# Patient Record
Sex: Female | Born: 1970
Health system: Southern US, Community
[De-identification: ages and names within clinical notes are randomized; demographics above are authoritative.]

## PROBLEM LIST (undated history)

## (undated) DIAGNOSIS — E538 Deficiency of other specified B group vitamins: Secondary | ICD-10-CM

## (undated) DIAGNOSIS — N809 Endometriosis, unspecified: Secondary | ICD-10-CM

## (undated) DIAGNOSIS — E559 Vitamin D deficiency, unspecified: Secondary | ICD-10-CM

## (undated) DIAGNOSIS — N83209 Unspecified ovarian cyst, unspecified side: Secondary | ICD-10-CM

## (undated) HISTORY — PX: ABDOMINAL HYSTERECTOMY: SHX81

## (undated) HISTORY — DX: Unspecified ovarian cyst, unspecified side: N83.209

## (undated) HISTORY — DX: Endometriosis, unspecified: N80.9

## (undated) HISTORY — PX: DILATION AND CURETTAGE OF UTERUS: SHX78

## (undated) HISTORY — DX: Vitamin D deficiency, unspecified: E55.9

## (undated) HISTORY — DX: Deficiency of other specified B group vitamins: E53.8

---

## 2004-12-25 ENCOUNTER — Inpatient Hospital Stay: Payer: Self-pay | Admitting: Obstetrics and Gynecology

## 2007-10-20 ENCOUNTER — Emergency Department: Payer: Self-pay | Admitting: Emergency Medicine

## 2007-10-21 ENCOUNTER — Emergency Department: Payer: Self-pay | Admitting: Emergency Medicine

## 2007-10-22 ENCOUNTER — Emergency Department: Payer: Self-pay | Admitting: Emergency Medicine

## 2007-10-25 ENCOUNTER — Emergency Department: Payer: Self-pay | Admitting: Emergency Medicine

## 2007-10-29 ENCOUNTER — Emergency Department: Payer: Self-pay | Admitting: Emergency Medicine

## 2007-11-05 ENCOUNTER — Emergency Department: Payer: Self-pay | Admitting: Emergency Medicine

## 2007-11-19 ENCOUNTER — Emergency Department: Payer: Self-pay | Admitting: Unknown Physician Specialty

## 2015-03-02 DIAGNOSIS — E559 Vitamin D deficiency, unspecified: Secondary | ICD-10-CM | POA: Insufficient documentation

## 2015-03-02 DIAGNOSIS — E538 Deficiency of other specified B group vitamins: Secondary | ICD-10-CM | POA: Insufficient documentation

## 2015-03-03 ENCOUNTER — Encounter: Payer: Self-pay | Admitting: Family Medicine

## 2015-03-03 ENCOUNTER — Ambulatory Visit (INDEPENDENT_AMBULATORY_CARE_PROVIDER_SITE_OTHER): Payer: BLUE CROSS/BLUE SHIELD | Admitting: Family Medicine

## 2015-03-03 VITALS — BP 134/85 | HR 64 | Temp 97.1°F | Ht 66.25 in | Wt 147.0 lb

## 2015-03-03 DIAGNOSIS — R450 Nervousness: Secondary | ICD-10-CM | POA: Diagnosis not present

## 2015-03-03 DIAGNOSIS — E538 Deficiency of other specified B group vitamins: Secondary | ICD-10-CM

## 2015-03-03 DIAGNOSIS — R634 Abnormal weight loss: Secondary | ICD-10-CM | POA: Diagnosis not present

## 2015-03-03 DIAGNOSIS — N898 Other specified noninflammatory disorders of vagina: Secondary | ICD-10-CM | POA: Diagnosis not present

## 2015-03-03 DIAGNOSIS — A499 Bacterial infection, unspecified: Secondary | ICD-10-CM

## 2015-03-03 DIAGNOSIS — Z0000 Encounter for general adult medical examination without abnormal findings: Secondary | ICD-10-CM

## 2015-03-03 DIAGNOSIS — B9689 Other specified bacterial agents as the cause of diseases classified elsewhere: Secondary | ICD-10-CM | POA: Insufficient documentation

## 2015-03-03 DIAGNOSIS — N760 Acute vaginitis: Secondary | ICD-10-CM

## 2015-03-03 DIAGNOSIS — Z8041 Family history of malignant neoplasm of ovary: Secondary | ICD-10-CM | POA: Diagnosis not present

## 2015-03-03 DIAGNOSIS — R1900 Intra-abdominal and pelvic swelling, mass and lump, unspecified site: Secondary | ICD-10-CM

## 2015-03-03 DIAGNOSIS — N644 Mastodynia: Secondary | ICD-10-CM | POA: Diagnosis not present

## 2015-03-03 DIAGNOSIS — E559 Vitamin D deficiency, unspecified: Secondary | ICD-10-CM

## 2015-03-03 HISTORY — DX: Intra-abdominal and pelvic swelling, mass and lump, unspecified site: R19.00

## 2015-03-03 MED ORDER — METRONIDAZOLE 500 MG PO TABS: 500.0000 mg | ORAL_TABLET | Freq: Two times a day (BID) | ORAL | Status: DC

## 2015-03-03 NOTE — Assessment & Plan Note (Signed)
Patient not sure if BRCA testing has been done; she'll check with other family member(s)

## 2015-03-03 NOTE — Assessment & Plan Note (Signed)
Clue cells on wet mount; treat with metronidazole; explained NOT sexually transmitted; no EtOH on med

## 2015-03-03 NOTE — Assessment & Plan Note (Signed)
Pelvic and breast issues, but will also check thyroid function since she feels jittery

## 2015-03-03 NOTE — Assessment & Plan Note (Signed)
Check vit D level. 

## 2015-03-03 NOTE — Assessment & Plan Note (Signed)
Check cbc and cmp 

## 2015-03-03 NOTE — Assessment & Plan Note (Signed)
Check level today 

## 2015-03-03 NOTE — Assessment & Plan Note (Signed)
May be hormonal, ovarian cyst, no lumps felt on exam; bilateral mammo ordered

## 2015-03-03 NOTE — Progress Notes (Signed)
BP 134/85 mmHg  Pulse 64  Temp(Src) 97.1 F (36.2 C)  Ht 5' 6.25" (1.683 m)  Wt 147 lb (66.679 kg)  BMI 23.54 kg/m2  SpO2 100%   Subjective:    Patient ID: Cassidy Lewis, female    DOB: 1970/11/11, 44 y.o.   MRN: [id number]  HPI: Jordon Bourquin is a 44 y.o. female  Chief Complaint  Patient presents with  . Menopause    Menopausal symptoms, jittery   Patient says she knows she needs to get a mammogram; she has never had one; her breasts feel bruised, like a seatbelt was across her breast Both sides bother her, left is worse No lumps there Husband said she should have a mammogram She hates wearing a bra; sore to the touch, tender She gets rid of the bra as soon as she gets home No nipple discharge, no lumps, no skin changes LMP was years ago due to hysterectomy (endometriosis) Strong hx of ovarian cancer and endometriosis; sister had hysterectomy for endometriosis; daughter has something similar to endometrosis; mother had hysterectomy and then got ovarian cancer; 2nd cousin died from ovarian cancer Last female exam and pap smear was two years ago  Feels jittery, scratching ; no abdominal bloating; no nausea She has lost a little bit of weight, but has gone back to work, back on the move  Relevant past medical, surgical, family and social history reviewed and updated as indicated. Interim medical history since our last visit reviewed. Allergies and medications reviewed and updated.  Review of Systems  Constitutional: Positive for unexpected weight change.  Gastrointestinal: Negative for nausea and abdominal distention.  Genitourinary: Negative for pelvic pain.   Per HPI unless specifically indicated above     Objective:    BP 134/85 mmHg  Pulse 64  Temp(Src) 97.1 F (36.2 C)  Ht 5' 6.25" (1.683 m)  Wt 147 lb (66.679 kg)  BMI 23.54 kg/m2  SpO2 100%  Wt Readings from Last 3 Encounters:  03/03/15 147 lb (66.679 kg)  04/14/14 158 lb (71.668 kg)    Physical  Exam  Constitutional: She appears well-developed and well-nourished.  Weight loss 11 pounds since September (10 months)  Eyes: EOM are normal. No scleral icterus.  Cardiovascular: Normal rate and regular rhythm.   Pulmonary/Chest: Effort normal and breath sounds normal. No respiratory distress. She has no wheezes. She has no rales. Right breast exhibits no inverted nipple, no mass, no nipple discharge, no skin change and no tenderness. Left breast exhibits no inverted nipple, no mass, no nipple discharge, no skin change and no tenderness. Breasts are symmetrical.  Abdominal: Soft. Bowel sounds are normal. She exhibits no distension and no mass. There is no tenderness. There is no guarding.  Genitourinary: No breast swelling, discharge or bleeding. Pelvic exam was performed with patient supine. There is no rash on the right labia. There is no rash on the left labia. Right adnexum displays no mass, no tenderness and no fullness. Left adnexum displays fullness. Left adnexum displays no mass (fullness) and no tenderness. No erythema, tenderness or bleeding in the vagina. Vaginal discharge (whitish, scant) found.  Skin: Skin is warm and dry. No erythema.  Few papular erythematous lesions left wrist, left hand, upper chest; these are not purpuric; no vesicles  Psychiatric: She has a normal mood and affect. Her speech is normal and behavior is normal. Judgment and thought content normal. Cognition and memory are normal.    No results found for this or any previous visit.  Assessment & Plan:   Problem List Items Addressed This Visit      Digestive   Vitamin B12 deficiency    Check level today      Relevant Orders   Vitamin B12     Genitourinary   Bacterial vaginosis    Clue cells on wet mount; treat with metronidazole; explained NOT sexually transmitted; no EtOH on med      Relevant Medications   metroNIDAZOLE (FLAGYL) 500 MG tablet     Other   Vitamin D deficiency disease    Check vit  D level      Relevant Orders   Vit D  25 hydroxy (rtn osteoporosis monitoring)   Breast tenderness in female - Primary    May be hormonal, ovarian cyst, no lumps felt on exam; bilateral mammo ordered      Relevant Orders   FSH   LH   MM Digital Diagnostic Bilat   US Pelvis Complete   Jittery    Check TSH      Unintended weight loss    Pelvic and breast issues, but will also check thyroid function since she feels jittery      Relevant Orders   TSH   US Pelvis Complete   Family history of ovarian cancer    Patient not sure if BRCA testing has been done; she'll check with other family member(s)      Relevant Orders   US Pelvis Complete   Preventative health care    Check cbc and cmp      Relevant Orders   Comprehensive metabolic panel   CBC with Differential/Platelet   Vaginal discharge    Wet mount positive for clue cells; treat with metronidazole      Relevant Orders   WET PREP FOR [place], YEAST, CLUE   Pelvic fullness in female    ddx includes ovarian cyst, abd wall lipoma, stool burden, but with fam hx of ovarian cancer and her weight loss, will get pelvic US      Relevant Orders   US Pelvis Complete      Follow up plan: Return in about 3 weeks (around 03/24/2015) for weight loss, jitteriness, other issues.   Orders Placed This Encounter  Procedures  . WET PREP FOR [employer], YEAST, CLUE  . MM Digital Diagnostic Bilat    Standing Status: Future     Number of Occurrences:      Standing Expiration Date: 05/03/2016    Order Specific Question:  Reason for Exam (SYMPTOM  OR DIAGNOSIS REQUIRED)    Answer:  breast tenderness    Order Specific Question:  Is the patient pregnant?    Answer:  No    Order Specific Question:  Preferred imaging location?    Answer:  [facility]  . US Pelvis Complete    Standing Status: Future     Number of Occurrences:      Standing Expiration Date: 05/03/2016    Order Specific Question:  Reason for Exam (SYMPTOM  OR  DIAGNOSIS REQUIRED)    Answer:  pelvic fullness, breast tenderness; fam hx of ovarian cancer    Order Specific Question:  Preferred imaging location?    Answer:  [facility]  . Vitamin B12  . Vit D  25 hydroxy (rtn osteoporosis monitoring)  . TSH  . Comprehensive metabolic panel    Order Specific Question:  Has the patient fasted?    Answer:  Yes  . CBC with Differential/Platelet  . [place]  . [place]

## 2015-03-03 NOTE — Assessment & Plan Note (Signed)
Check TSH 

## 2015-03-03 NOTE — Assessment & Plan Note (Signed)
Wet mount positive for clue cells; treat with metronidazole

## 2015-03-03 NOTE — Patient Instructions (Signed)
I have ordered a mammogram and a pelvic US We'll let you know about the labs If you have not heard anything from my staff in a week about any orders/referrals/studies from today, please contact us here to follow-up [phone] Do practice monthly self-breast exams and get yearly (or every other year) mammmograms

## 2015-03-03 NOTE — Assessment & Plan Note (Signed)
ddx includes ovarian cyst, abd wall lipoma, stool burden, but with fam hx of ovarian cancer and her weight loss, will get pelvic US

## 2015-03-04 ENCOUNTER — Telehealth: Payer: Self-pay

## 2015-03-04 ENCOUNTER — Encounter: Payer: Self-pay | Admitting: Family Medicine

## 2015-03-04 DIAGNOSIS — R1900 Intra-abdominal and pelvic swelling, mass and lump, unspecified site: Secondary | ICD-10-CM

## 2015-03-04 LAB — COMPREHENSIVE METABOLIC PANEL
ALBUMIN: 4.4 g/dL (ref 3.5–5.5)
ALT: 13 IU/L (ref 0–32)
AST: 12 IU/L (ref 0–40)
Albumin/Globulin Ratio: 1.7 (ref 1.1–2.5)
Alkaline Phosphatase: 88 IU/L (ref 39–117)
BILIRUBIN TOTAL: 0.2 mg/dL (ref 0.0–1.2)
BUN/Creatinine Ratio: 15 (ref 9–23)
BUN: 9 mg/dL (ref 6–24)
CALCIUM: 9.6 mg/dL (ref 8.7–10.2)
CHLORIDE: 99 mmol/L (ref 97–108)
CO2: 25 mmol/L (ref 18–29)
Creatinine, Ser: 0.6 mg/dL (ref 0.57–1.00)
GFR, EST AFRICAN AMERICAN: 127 mL/min/{1.73_m2} (ref >59)
GFR, EST NON AFRICAN AMERICAN: 110 mL/min/{1.73_m2} (ref >59)
GLOBULIN, TOTAL: 2.6 g/dL (ref 1.5–4.5)
GLUCOSE: 91 mg/dL (ref 65–99)
Potassium: 4.7 mmol/L (ref 3.5–5.2)
Sodium: 139 mmol/L (ref 134–144)
Total Protein: 7 g/dL (ref 6.0–8.5)

## 2015-03-04 LAB — WET PREP FOR TRICH, YEAST, CLUE
Clue Cell Exam: POSITIVE — AB
TRICHOMONAS EXAM: NEGATIVE
Yeast Exam: NEGATIVE

## 2015-03-04 LAB — CBC WITH DIFFERENTIAL/PLATELET
BASOS: 0 %
Basophils Absolute: 0 10*3/uL (ref 0.0–0.2)
EOS (ABSOLUTE): 0.2 10*3/uL (ref 0.0–0.4)
Eos: 2 %
HEMATOCRIT: 37.9 % (ref 34.0–46.6)
HEMOGLOBIN: 12.4 g/dL (ref 11.1–15.9)
Immature Grans (Abs): 0 10*3/uL (ref 0.0–0.1)
Immature Granulocytes: 0 %
LYMPHS ABS: 2.2 10*3/uL (ref 0.7–3.1)
Lymphs: 23 %
MCH: 28.7 pg (ref 26.6–33.0)
MCHC: 32.7 g/dL (ref 31.5–35.7)
MCV: 88 fL (ref 79–97)
MONOCYTES: 6 %
Monocytes Absolute: 0.6 10*3/uL (ref 0.1–0.9)
NEUTROS ABS: 6.5 10*3/uL (ref 1.4–7.0)
Neutrophils: 69 %
PLATELETS: 249 10*3/uL (ref 150–379)
RBC: 4.32 x10E6/uL (ref 3.77–5.28)
RDW: 13.3 % (ref 12.3–15.4)
WBC: 9.5 10*3/uL (ref 3.4–10.8)

## 2015-03-04 LAB — LUTEINIZING HORMONE: LH: 6.2 m[IU]/mL

## 2015-03-04 LAB — FOLLICLE STIMULATING HORMONE: FSH: 9.5 m[IU]/mL

## 2015-03-04 LAB — VITAMIN B12: Vitamin B-12: 813 pg/mL (ref 211–946)

## 2015-03-04 LAB — VITAMIN D 25 HYDROXY (VIT D DEFICIENCY, FRACTURES): Vit D, 25-Hydroxy: 26.4 ng/mL — ABNORMAL LOW (ref 30.0–100.0)

## 2015-03-04 LAB — TSH: TSH: 1.63 u[IU]/mL (ref 0.450–4.500)

## 2015-03-04 NOTE — Telephone Encounter (Signed)
New order entered

## 2015-03-04 NOTE — Assessment & Plan Note (Signed)
Hospital needs new order, transvag non-ob entered

## 2015-03-04 NOTE — Telephone Encounter (Signed)
The hospital needs her u/s changed to a pelvic with transvaginal.

## 2015-03-11 ENCOUNTER — Ambulatory Visit
Admission: RE | Admit: 2015-03-11 | Discharge: 2015-03-11 | Disposition: A | Discharge status: Home / Self Care | Payer: BLUE CROSS/BLUE SHIELD | Source: Ambulatory Visit | Attending: Family Medicine | Admitting: Family Medicine

## 2015-03-11 ENCOUNTER — Telehealth: Payer: Self-pay | Admitting: Family Medicine

## 2015-03-11 DIAGNOSIS — R1900 Intra-abdominal and pelvic swelling, mass and lump, unspecified site: Secondary | ICD-10-CM

## 2015-03-11 DIAGNOSIS — N8320 Unspecified ovarian cysts: Secondary | ICD-10-CM | POA: Insufficient documentation

## 2015-03-11 DIAGNOSIS — R634 Abnormal weight loss: Secondary | ICD-10-CM | POA: Diagnosis present

## 2015-03-11 DIAGNOSIS — N644 Mastodynia: Secondary | ICD-10-CM

## 2015-03-11 DIAGNOSIS — Z8041 Family history of malignant neoplasm of ovary: Secondary | ICD-10-CM

## 2015-03-11 NOTE — Telephone Encounter (Signed)
I spoke with patient, advised her I'd call [facility] on Monday to schedule her appointment, they are already closed today.

## 2015-03-14 ENCOUNTER — Other Ambulatory Visit: Payer: Self-pay

## 2015-03-14 ENCOUNTER — Telehealth: Payer: Self-pay | Admitting: Family Medicine

## 2015-03-14 DIAGNOSIS — Z1239 Encounter for other screening for malignant neoplasm of breast: Secondary | ICD-10-CM

## 2015-03-14 DIAGNOSIS — Z0000 Encounter for general adult medical examination without abnormal findings: Secondary | ICD-10-CM

## 2015-03-14 DIAGNOSIS — N83292 Other ovarian cyst, left side: Secondary | ICD-10-CM

## 2015-03-14 NOTE — Telephone Encounter (Signed)
Patient has a complex cyst on the left; f/u US recommended; with her family hx, I'm going to recommend referral to GYN for this and for f/u and management I called both numbers, left message asking her to call me here

## 2015-03-14 NOTE — Telephone Encounter (Signed)
Pt called back and i didn't feel comfortable discussing the referral because i wasn't sure of any details so pt said you could call her back at [phone].

## 2015-03-14 NOTE — Telephone Encounter (Signed)
Spoke with patient, clarified what Dr. Sherie Don had mentioned to her.

## 2015-03-14 NOTE — Telephone Encounter (Signed)
Patient notified of mammo appointment time. [facility] stated with her diagnosis, it needed to be a screening and not a diagnostic.

## 2015-03-14 NOTE — Telephone Encounter (Signed)
Pt states she does not fully understand everything Dr Sherie Don said when she called.  If you could call and clarify.

## 2015-03-14 NOTE — Telephone Encounter (Signed)
I explained report; will refer to GYN for monitoring, f/u imaging in 6 weeks; she agrees

## 2015-03-23 ENCOUNTER — Ambulatory Visit
Admission: RE | Admit: 2015-03-23 | Discharge: 2015-03-23 | Disposition: A | Discharge status: Home / Self Care | Payer: BLUE CROSS/BLUE SHIELD | Source: Ambulatory Visit | Attending: Family Medicine | Admitting: Family Medicine

## 2015-03-23 DIAGNOSIS — Z1239 Encounter for other screening for malignant neoplasm of breast: Secondary | ICD-10-CM

## 2015-03-23 DIAGNOSIS — Z1231 Encounter for screening mammogram for malignant neoplasm of breast: Secondary | ICD-10-CM | POA: Insufficient documentation

## 2015-03-23 DIAGNOSIS — Z0000 Encounter for general adult medical examination without abnormal findings: Secondary | ICD-10-CM

## 2015-03-23 DIAGNOSIS — R928 Other abnormal and inconclusive findings on diagnostic imaging of breast: Secondary | ICD-10-CM

## 2015-03-24 ENCOUNTER — Encounter: Payer: Self-pay | Admitting: Family Medicine

## 2015-03-24 ENCOUNTER — Telehealth: Payer: Self-pay

## 2015-03-24 ENCOUNTER — Ambulatory Visit (INDEPENDENT_AMBULATORY_CARE_PROVIDER_SITE_OTHER): Payer: BLUE CROSS/BLUE SHIELD | Admitting: Family Medicine

## 2015-03-24 VITALS — BP 137/83 | HR 85 | Temp 97.7°F | Wt 151.0 lb

## 2015-03-24 DIAGNOSIS — N8329 Other ovarian cysts: Secondary | ICD-10-CM

## 2015-03-24 DIAGNOSIS — B9689 Other specified bacterial agents as the cause of diseases classified elsewhere: Secondary | ICD-10-CM

## 2015-03-24 DIAGNOSIS — R450 Nervousness: Secondary | ICD-10-CM | POA: Diagnosis not present

## 2015-03-24 DIAGNOSIS — N83292 Other ovarian cyst, left side: Secondary | ICD-10-CM

## 2015-03-24 DIAGNOSIS — R928 Other abnormal and inconclusive findings on diagnostic imaging of breast: Secondary | ICD-10-CM | POA: Diagnosis not present

## 2015-03-24 DIAGNOSIS — R634 Abnormal weight loss: Secondary | ICD-10-CM | POA: Diagnosis not present

## 2015-03-24 DIAGNOSIS — N760 Acute vaginitis: Secondary | ICD-10-CM

## 2015-03-24 DIAGNOSIS — A499 Bacterial infection, unspecified: Secondary | ICD-10-CM | POA: Diagnosis not present

## 2015-03-24 NOTE — Progress Notes (Signed)
BP 137/83 mmHg  Pulse 85  Temp(Src) 97.7 F (36.5 C)  Wt 151 lb (68.493 kg)  SpO2 100%   Subjective:    Patient ID: Cassidy Lewis, female    DOB: [birth year], 44 y.o.   MRN: [id number]  HPI: Cassidy Lewis is a 44 y.o. female  Chief Complaint  Patient presents with  . Weight Loss    follow up  . OTHER    Jittery  . Results    discuss her lab results from last visit  She feels jittery Trying to cut back on caffeine Has a can of Pepsi in the morning to get her going Does not eat breakfast in the morning Goes in at 4 am for work; takes a break around 9 am, eats something then; mozarella sticks; muffin today with ham, egg, cheese; maybe a bag of chips or snack around 3 pm; can of soup for dinner or regular dinner, variable; average day she might drink 3 cans of pepsi but has been cutting back lately; drinking more water at work and has recruited help; can manage 48 ounces of water a day She has never really tried to eat something after the shakes start; she has started to think about it and thinks maybe it is happening when she hasn't eaten anything Thyroid test was normal Not going through menopause according to the [facility] and FSH just drawn, reviewed those and other labs with her today  Relevant past medical, surgical, family and social history reviewed and updated as indicated. Interim medical history since our last visit reviewed. Allergies and medications reviewed and updated.  Review of Systems  Per HPI unless specifically indicated above     Objective:    BP 137/83 mmHg  Pulse 85  Temp(Src) 97.7 F (36.5 C)  Wt 151 lb (68.493 kg)  SpO2 100%  Wt Readings from Last 3 Encounters:  03/24/15 151 lb (68.493 kg)  03/03/15 147 lb (66.679 kg)  04/14/14 158 lb (71.668 kg)    Physical Exam  Constitutional: She appears well-developed and well-nourished. No distress.  Eyes: No scleral icterus.  Neck: No thyromegaly present.  Cardiovascular: Normal rate.    Pulmonary/Chest: Effort normal.  Abdominal: She exhibits no distension.  Neurological: She displays no tremor. Gait normal.  Skin: No pallor.  Psychiatric: She has a normal mood and affect. Her behavior is normal. Judgment and thought content normal.   Results for orders placed or performed in visit on 03/03/15  WET PREP FOR TRICH, YEAST, CLUE  Result Value Ref Range   Trichomonas Exam Negative Negative   Yeast Exam Negative Negative   Clue Cell Exam Positive (A) Negative  Vitamin B12  Result Value Ref Range   Vitamin B-12 813 211 - 946 pg/mL  Vit D  25 hydroxy (rtn osteoporosis monitoring)  Result Value Ref Range   Vit D, 25-Hydroxy 26.4 (L) 30.0 - 100.0 ng/mL  TSH  Result Value Ref Range   TSH 1.630 0.450 - 4.500 uIU/mL  Comprehensive metabolic panel  Result Value Ref Range   Glucose 91 65 - 99 mg/dL   BUN 9 6 - 24 mg/dL   Creatinine, Ser 4.09 0.57 - 1.00 mg/dL   GFR calc non Af Amer 110 >59 mL/min/1.73   GFR calc Af Amer 127 >59 mL/min/1.73   BUN/Creatinine Ratio 15 9 - 23   Sodium 139 134 - 144 mmol/L   Potassium 4.7 3.5 - 5.2 mmol/L   Chloride 99 97 - 108 mmol/L   CO2 25  18 - 29 mmol/L   Calcium 9.6 8.7 - 10.2 mg/dL   Total Protein 7.0 6.0 - 8.5 g/dL   Albumin 4.4 3.5 - 5.5 g/dL   Globulin, Total 2.6 1.5 - 4.5 g/dL   Albumin/Globulin Ratio 1.7 1.1 - 2.5   Bilirubin Total 0.2 0.0 - 1.2 mg/dL   Alkaline Phosphatase 88 39 - 117 IU/L   AST 12 0 - 40 IU/L   ALT 13 0 - 32 IU/L  CBC with Differential/Platelet  Result Value Ref Range   WBC 9.5 3.4 - 10.8 x10E3/uL   RBC 4.32 3.77 - 5.28 x10E6/uL   Hemoglobin 12.4 11.1 - 15.9 g/dL   Hematocrit 16.1 09.6 - 46.6 %   MCV 88 79 - 97 fL   MCH 28.7 26.6 - 33.0 pg   MCHC 32.7 31.5 - 35.7 g/dL   RDW 04.5 40.9 - 81.1 %   Platelets 249 150 - 379 x10E3/uL   Neutrophils 69 %   Lymphs 23 %   Monocytes 6 %   Eos 2 %   Basos 0 %   Neutrophils Absolute 6.5 1.4 - 7.0 x10E3/uL   Lymphocytes Absolute 2.2 0.7 - 3.1 x10E3/uL    Monocytes Absolute 0.6 0.1 - 0.9 x10E3/uL   EOS (ABSOLUTE) 0.2 0.0 - 0.4 x10E3/uL   Basophils Absolute 0.0 0.0 - 0.2 x10E3/uL   Immature Granulocytes 0 %   Immature Grans (Abs) 0.0 0.0 - 0.1 x10E3/uL  FSH  Result Value Ref Range   FSH 9.5 mIU/mL  LH  Result Value Ref Range   LH 6.2 mIU/mL      Assessment & Plan:   Problem List Items Addressed This Visit      Genitourinary   Bacterial vaginosis    Status post treatment; reviewed lab with her; no need for husband to be treated      Complex cyst of left ovary    She is waiting for appt with GYN; if appt not arranged prior to the time when the [place] is due, patient was asked to call me and I'll order the [place]; I do not want that delayed; asked her to do this on the 6 week side, not the 12 week side; she agrees        Other   Jittery - Primary    Suspicious for reactive hypoglycemia; the more she explained her eating habits, skipping meals, drinking caffeinated beverages, I suspect her blood sugars are dropping; check FSBS during episodes, advised snacks to help raise sugar without precipitating yet another low sugar shortly thereafter      Unintended weight loss    Complex ovarian cyst; patient has been referred to GYN rather than wait for repeat US; however, patient knows that if GYN appt delayed, she is to call me for the follow-up scan      Abnormal mammogram of right breast    Reviewed findings in report with her; additional imaging has been ordered         Follow up plan: No Follow-up on file. Patient to call  An after-visit summary was printed and given to the patient at check-out.  Please see the patient instructions which may contain other information and recommendations beyond what is mentioned above in the assessment and plan.

## 2015-03-24 NOTE — Patient Instructions (Addendum)
Consider taking rose hip oil and/or vitamin E supplements for breast tenderness For one or two weeks, check your blood sugar when you start to feel jittery Eat a little something, don't eat pure sugar, something with some carbs and a little protein instead of just skittles and M&Ms Check sugar after eating, 20-30 minutes later to see if it's come up and you feel better We'll get additional breast images Your follow-up ultrasound should be 6 weeks after August 5th If your appointment with gynecologist is delayed, just call me before that 6 week window and I'll order the [place] Continue the vitamin D supplementation Give me feedback over the phone in a few weeks and let me know how you are doing We can repeat your thyroid test in 8 weeks if you are still having symptoms

## 2015-03-26 NOTE — Assessment & Plan Note (Signed)
Reviewed findings in report with her; additional imaging has been ordered

## 2015-03-26 NOTE — Assessment & Plan Note (Signed)
Complex ovarian cyst; patient has been referred to GYN rather than wait for repeat US; however, patient knows that if GYN appt delayed, she is to call me for the follow-up scan

## 2015-03-26 NOTE — Assessment & Plan Note (Signed)
She is waiting for appt with GYN; if appt not arranged prior to the time when the [place] is due, patient was asked to call me and I'll order the [place]; I do not want that delayed; asked her to do this on the 6 week side, not the 12 week side; she agrees

## 2015-03-26 NOTE — Assessment & Plan Note (Signed)
Status post treatment; reviewed lab with her; no need for husband to be treated

## 2015-03-26 NOTE — Assessment & Plan Note (Signed)
Suspicious for reactive hypoglycemia; the more she explained her eating habits, skipping meals, drinking caffeinated beverages, I suspect her blood sugars are dropping; check FSBS during episodes, advised snacks to help raise sugar without precipitating yet another low sugar shortly thereafter

## 2015-03-30 ENCOUNTER — Ambulatory Visit
Admission: RE | Admit: 2015-03-30 | Discharge: 2015-03-30 | Disposition: A | Discharge status: Home / Self Care | Payer: BLUE CROSS/BLUE SHIELD | Source: Ambulatory Visit | Attending: Family Medicine | Admitting: Family Medicine

## 2015-03-30 DIAGNOSIS — R928 Other abnormal and inconclusive findings on diagnostic imaging of breast: Secondary | ICD-10-CM | POA: Diagnosis present

## 2015-03-30 DIAGNOSIS — N6001 Solitary cyst of right breast: Secondary | ICD-10-CM | POA: Insufficient documentation

## 2015-05-12 ENCOUNTER — Encounter: Payer: BLUE CROSS/BLUE SHIELD | Admitting: Obstetrics and Gynecology

## 2015-06-09 ENCOUNTER — Ambulatory Visit (INDEPENDENT_AMBULATORY_CARE_PROVIDER_SITE_OTHER): Payer: BLUE CROSS/BLUE SHIELD | Admitting: Obstetrics and Gynecology

## 2015-06-09 ENCOUNTER — Encounter: Payer: Self-pay | Admitting: Obstetrics and Gynecology

## 2015-06-09 VITALS — BP 123/79 | HR 80 | Ht 66.25 in | Wt 152.1 lb

## 2015-06-09 DIAGNOSIS — N83202 Unspecified ovarian cyst, left side: Secondary | ICD-10-CM | POA: Diagnosis not present

## 2015-06-09 DIAGNOSIS — Z8041 Family history of malignant neoplasm of ovary: Secondary | ICD-10-CM | POA: Diagnosis not present

## 2015-06-09 DIAGNOSIS — N951 Menopausal and female climacteric states: Secondary | ICD-10-CM

## 2015-06-10 ENCOUNTER — Encounter: Payer: Self-pay | Admitting: Obstetrics and Gynecology

## 2015-06-10 DIAGNOSIS — N951 Menopausal and female climacteric states: Secondary | ICD-10-CM | POA: Insufficient documentation

## 2015-06-10 NOTE — Progress Notes (Signed)
GYNECOLOGY PROGRESS NOTE  Subjective:    Patient ID: Cassidy Lewis, female    DOB: 1970-08-25, 44 y.o.   MRN: [id number]  HPI  Patient is a 44 y.o. female who presents as a referral from [facility] for findings of ovarian cyst on ultrasound.  Patient notes that she was having mild left intermittent pelvic pain, which prompted evaluation.  Patient notes h/o ovarian cysts in the past.    Past Medical History  Diagnosis Date  . Vitamin D deficiency disease   . Vitamin B12 deficiency   . Ovarian cyst   . Endometriosis     Past Surgical History  Procedure Laterality Date  . Dilation and curettage of uterus    . Cesarean section      x 3  . Abdominal hysterectomy      partial    Family History  Problem Relation Age of Onset  . Cancer Mother     ovarian  . Diabetes Father   . Hyperlipidemia Father   . Hypertension Father   . Diabetes Maternal Grandmother   . Cancer Paternal Grandfather     lung cancer, died at age 7    Social History   Social History  . Marital Status: Married    Spouse Name: N/A  . Number of Children: N/A  . Years of Education: N/A   Occupational History  . Not on file.   Social History Main Topics  . Smoking status: Never Smoker   . Smokeless tobacco: Never Used  . Alcohol Use: No     Comment: very rare  . Drug Use: No  . Sexual Activity: Yes    Birth Control/ Protection: None   Other Topics Concern  . Not on file   Social History Narrative    Current Outpatient Prescriptions on File Prior to Visit  Medication Sig Dispense Refill  . Cholecalciferol (VITAMIN D3) 5000 UNITS TABS Take 5,000 Units by mouth daily.    . Multiple Vitamins-Minerals (HAIR SKIN AND NAILS FORMULA PO) Take by mouth daily.    . vitamin B-12 (CYANOCOBALAMIN) 250 MCG tablet Take 250 mcg by mouth daily.     No current facility-administered medications on file prior to visit.    Allergies  Allergen Reactions  . Penicillins Anaphylaxis      Review of Systems A comprehensive review of systems was negative except for: Constitutional: positive for weight loss Genitourinary: positive for hot flashes and night sweats, and pelvic pain (intermittent, mild)  Objective:   Blood pressure 123/79, pulse 80, height 5' 6.25" (1.683 m), weight 152 lb 1.6 oz (68.992 kg). General appearance: alert and no distress Abdomen: soft, non-tender; bowel sounds normal; no masses,  no organomegaly Pelvic: external genitalia normal, no adnexal masses or tenderness, rectovaginal septum normal, uterus surgically absent and vagina normal without discharge Extremities: extremities normal, atraumatic, no cyanosis or edema Neurologic: Grossly normal   Imaging:  Pelvic Ultrasound 03/11/2015 FINDINGS: Uterus  The uterus is surgically absent.  Right ovary: Measurements: 2.2 x 1.6 x 2.5 cm. There are prominent follicles but no suspicious mass.  Left ovary: Measurements: 3.4 x 1.5 x 2.6 cm. There prominent follicles. There is a complex cystic structure measuring 1.2 x 0.8 x 1.3 cm.  Other findings: No free fluid.  IMPRESSION: 1. Small complex cystic structure in the left ovary. This likely reflects a hemorrhagic cyst. There is no free pelvic fluid. 2. Normal appearing right ovary. 3. Follow-up pelvic ultrasound in 6-12 weeks is recommended.  Assessment:  Complex small left ovarian cyst  Family h/o ovarian cancer Vasomotor symptoms  Plan:   1) Patient with ovarian cyst, also with h/o ovarian cysts in the past.  Cyst appears to be hemorrhagic based on ultrasound.  Dicussed that these cysts typically resolve without intervention.  Will repeat sono to assess if cyst is still present or has enlarged in size, as last sono was 2 months ago and patient still with intermittent symptoms.  2) Lengthy discussion had with patient regarding family history of ovarian cancer.  Patient notes mom and 1st cousin with h/o ovarian cancer, several family  members with endometriosis. Cancer family history form completed with patient.  Discussed BRCA gene testing.  Patient notes that her her PCP attempted once but that insurance would not cover. Will have patient referred to genetic counselor.  Can possibly have Myriad testing performed.  Given handout. Will discuss again at next visit.   3) Vasomotor symptoms with hot flushes and night sweats. Explained to patient that she was likely perimenopausal. Briefly discussed treatment options, including hormonal and non-hormonal therapy.   F/u in 2 weeks for ultrasound.    A total of 30 minutes were spent face-to-face with the patient during this encounter and over half of that time involved counseling and coordination of care.    Rubie Maid, MD [facility]

## 2015-06-29 ENCOUNTER — Ambulatory Visit (INDEPENDENT_AMBULATORY_CARE_PROVIDER_SITE_OTHER): Payer: BLUE CROSS/BLUE SHIELD | Admitting: Obstetrics and Gynecology

## 2015-06-29 ENCOUNTER — Encounter: Payer: Self-pay | Admitting: Obstetrics and Gynecology

## 2015-06-29 ENCOUNTER — Ambulatory Visit: Payer: BLUE CROSS/BLUE SHIELD

## 2015-06-29 VITALS — BP 127/88 | HR 74 | Ht 66.25 in | Wt 152.7 lb

## 2015-06-29 DIAGNOSIS — Z8041 Family history of malignant neoplasm of ovary: Secondary | ICD-10-CM

## 2015-06-29 DIAGNOSIS — Z8742 Personal history of other diseases of the female genital tract: Secondary | ICD-10-CM | POA: Diagnosis not present

## 2015-06-29 DIAGNOSIS — N83202 Unspecified ovarian cyst, left side: Secondary | ICD-10-CM

## 2015-06-29 NOTE — Progress Notes (Signed)
GYNECOLOGY PROGRESS NOTE  Subjective:    Patient ID: Cassidy Lewis, female    DOB: [birth year], 44 y.o.   MRN: [id number]  HPI  Patient is a 44 y.o. P54 female who presents for f/u after ultrasound for left ovarian cyst.  Notes that she still occasionally feels some small "twinges" of pain, but nothing severe.  Also for further discussion of genetic testing for family h/o ovarian cancer.   The following portions of the patient's history were reviewed and updated as appropriate: allergies, current medications, past family history, past medical history, past social history, past surgical history and problem list.  Review of Systems Pertinent items noted in HPI and remainder of comprehensive ROS otherwise negative.   Objective:   Blood pressure 127/88, pulse 74, height 5' 6.25" (1.683 m), weight 152 lb 11.2 oz (69.264 kg). General appearance: alert and no distress Remainder of exam deferred.   Imaging - Pelvic Ultrasound 06/29/2015:   Indications: F/U left ovarian cyst Findings:  The uterus is surgically absent s/p hysterectomy. Vaginal cuff appears wnl.   Right Ovary measures 2.7 x 2.0 x 1.8 cm. A dominant follicle is seen, otherwise, appears WNL. Left Ovary measures 3.5 x 1.8 x 2.1 cm. There are multiple dominant follicles seen, the largest measuring 1.6 x 1.0 x 1.4 cm. Survey of the adnexa demonstrates no adnexal masses. There is no free fluid in the cul de sac.  Impression: 1. Left ovary with multiple dominant follicles, largest measuring 1.6 x 1.0 x 1.4 cm. All appear simple, with no internal septations noted.  Recommendations: 1.Clinical correlation with the patient's History and Physical Exam.  Assessment:   Left ovarian cyst, resolved Family h/o ovarian cancer  Plan:   1.  Resolution of ovarian cyst noted. Patient with h/o cysts in the past.  Will continue to monitor for future cyst development.  Advised that most cysts respond to hormonal suppression (i.e.  Contraception).  Patient does not desire to be on contraception.   2. Patient with significant family history of ovarian cancer. Patient notes mom and 1st cousin with h/o ovarian cancer, several family members with endometriosis. Further discussed BRCA gene testing. Patient notes that her her PCP attempted once but that insurance would not cover. Referred to genetic counselor last visit but notes that she has not heard anything further regarding an appointment. Will proceed with  [facility] testing. Once results return, will have patient follow up for discussion.    A total of 15 minutes were spent face-to-face with the patient during this encounter and over half of that time dealt with counseling and coordination of care.   Rubie Maid, MD [facility]

## 2015-07-20 ENCOUNTER — Ambulatory Visit (INDEPENDENT_AMBULATORY_CARE_PROVIDER_SITE_OTHER): Payer: BLUE CROSS/BLUE SHIELD | Admitting: Family Medicine

## 2015-07-20 ENCOUNTER — Encounter: Payer: Self-pay | Admitting: Family Medicine

## 2015-07-20 VITALS — BP 128/85 | HR 81 | Temp 97.3°F | Ht 66.0 in | Wt 149.0 lb

## 2015-07-20 DIAGNOSIS — N83292 Other ovarian cyst, left side: Secondary | ICD-10-CM

## 2015-07-20 DIAGNOSIS — M7540 Impingement syndrome of unspecified shoulder: Secondary | ICD-10-CM | POA: Insufficient documentation

## 2015-07-20 DIAGNOSIS — Z8041 Family history of malignant neoplasm of ovary: Secondary | ICD-10-CM | POA: Diagnosis not present

## 2015-07-20 DIAGNOSIS — M25511 Pain in right shoulder: Secondary | ICD-10-CM | POA: Diagnosis not present

## 2015-07-20 DIAGNOSIS — M7541 Impingement syndrome of right shoulder: Secondary | ICD-10-CM

## 2015-07-20 DIAGNOSIS — R1900 Intra-abdominal and pelvic swelling, mass and lump, unspecified site: Secondary | ICD-10-CM

## 2015-07-20 NOTE — Assessment & Plan Note (Addendum)
May have element of rotator cuff injury; will try conservative measures, turmeric, ice, and refer to physical therapy; if not improving, she may call for referral to orthopaedist

## 2015-07-20 NOTE — Patient Instructions (Addendum)
Try ice topically 15-20 minutes at a time three times a day; cloth between skin and ice Try turmeric as a natural anti-inflammatory (for pain and arthritis). It comes in capsules where you buy aspirin and fish oil, but also as a spice where you buy pepper and garlic powder. We'll order physical therapy for you If not getting better, please call me and we'll have you see the orthopaedist

## 2015-07-20 NOTE — Assessment & Plan Note (Signed)
Followed by GYN, patient had 2nd [place]

## 2015-07-20 NOTE — Assessment & Plan Note (Signed)
Followed by GYN

## 2015-07-20 NOTE — Progress Notes (Signed)
BP 128/85 mmHg  Pulse 81  Temp(Src) 97.3 F (36.3 C)  Ht 5' 6"  (1.676 m)  Wt 149 lb (67.586 kg)  BMI 24.06 kg/m2  SpO2 99%   Subjective:    Patient ID: Cassidy Lewis, female    DOB: Mar 03, 1971, 44 y.o.   MRN: [id number]  HPI: Onell Mcmath is a 44 y.o. female  Chief Complaint  Patient presents with  . pulled muscle    Right arm. She thinks she pulled a muscle playing dodge ball several months ago, then played kick ball 2 months ago and it's worse now..    She feels like she has a knot in her right deltoid; tries to abduct her right arm/shoulder and it hurts; she was pulling dodgeball about five months ago, and had some soreness after that; then played kickball and agitated them again;  Trouble fixing her hair in the morning; cannot reach behind her back with the right arm No weakness or tingling except with shooting pain with movement; stopping and waiting and it will alleviate it Bringing the arm across the front of the chest eases things up Cannot sleep or lay on the right shoulder or arm Really feels tight along the right deltoid; feels bruised; tightens and gets worse with movement She has tried hot water in the shower which relaxes things up a bit She has not tried NSAIDs; her husband used to take a lot of Aleve and it messed up his kidneys so she is not interested in NSAIDS No old injuries to the right shoulder Right-handed  Relevant past medical, surgical, family and social history reviewed and updated as indicated. Interim medical history since our last visit reviewed. Allergies and medications reviewed and updated.  Review of Systems Per HPI unless specifically indicated above     Objective:    BP 128/85 mmHg  Pulse 81  Temp(Src) 97.3 F (36.3 C)  Ht 5' 6"  (1.676 m)  Wt 149 lb (67.586 kg)  BMI 24.06 kg/m2  SpO2 99%  Wt Readings from Last 3 Encounters:  07/20/15 149 lb (67.586 kg)  06/29/15 152 lb 11.2 oz (69.264 kg)  06/09/15 152 lb 1.6 oz (68.992  kg)   weight loss is intentional she reports  Physical Exam  Constitutional: She appears well-developed and well-nourished.  Eyes: No scleral icterus.  Neck: No JVD present.  Cardiovascular: Normal rate.   Pulmonary/Chest: Effort normal. No respiratory distress.  Musculoskeletal:       Right shoulder: She exhibits decreased range of motion and tenderness. She exhibits no bony tenderness, no swelling, no effusion, no crepitus, no deformity, no spasm, normal pulse and normal strength.  Limited abduction of right shoulder/arm  Psychiatric: She has a normal mood and affect.   Results for orders placed or performed in visit on 03/03/15  WET PREP FOR Hepburn, YEAST, CLUE  Result Value Ref Range   Trichomonas Exam Negative Negative   Yeast Exam Negative Negative   Clue Cell Exam Positive (A) Negative  Vitamin B12  Result Value Ref Range   Vitamin B-12 813 211 - 946 pg/mL  Vit D  25 hydroxy (rtn osteoporosis monitoring)  Result Value Ref Range   Vit D, 25-Hydroxy 26.4 (L) 30.0 - 100.0 ng/mL  TSH  Result Value Ref Range   TSH 1.630 0.450 - 4.500 uIU/mL  Comprehensive metabolic panel  Result Value Ref Range   Glucose 91 65 - 99 mg/dL   BUN 9 6 - 24 mg/dL   Creatinine, Ser 0.60 0.57 - 1.00  mg/dL   GFR calc non Af Amer 110 >59 mL/min/1.73   GFR calc Af Amer 127 >59 mL/min/1.73   BUN/Creatinine Ratio 15 9 - 23   Sodium 139 134 - 144 mmol/L   Potassium 4.7 3.5 - 5.2 mmol/L   Chloride 99 97 - 108 mmol/L   CO2 25 18 - 29 mmol/L   Calcium 9.6 8.7 - 10.2 mg/dL   Total Protein 7.0 6.0 - 8.5 g/dL   Albumin 4.4 3.5 - 5.5 g/dL   Globulin, Total 2.6 1.5 - 4.5 g/dL   Albumin/Globulin Ratio 1.7 1.1 - 2.5   Bilirubin Total 0.2 0.0 - 1.2 mg/dL   Alkaline Phosphatase 88 39 - 117 IU/L   AST 12 0 - 40 IU/L   ALT 13 0 - 32 IU/L  CBC with Differential/Platelet  Result Value Ref Range   WBC 9.5 3.4 - 10.8 x10E3/uL   RBC 4.32 3.77 - 5.28 x10E6/uL   Hemoglobin 12.4 11.1 - 15.9 g/dL   Hematocrit  37.9 34.0 - 46.6 %   MCV 88 79 - 97 fL   MCH 28.7 26.6 - 33.0 pg   MCHC 32.7 31.5 - 35.7 g/dL   RDW 13.3 12.3 - 15.4 %   Platelets 249 150 - 379 x10E3/uL   Neutrophils 69 %   Lymphs 23 %   Monocytes 6 %   Eos 2 %   Basos 0 %   Neutrophils Absolute 6.5 1.4 - 7.0 x10E3/uL   Lymphocytes Absolute 2.2 0.7 - 3.1 x10E3/uL   Monocytes Absolute 0.6 0.1 - 0.9 x10E3/uL   EOS (ABSOLUTE) 0.2 0.0 - 0.4 x10E3/uL   Basophils Absolute 0.0 0.0 - 0.2 x10E3/uL   Immature Granulocytes 0 %   Immature Grans (Abs) 0.0 0.0 - 0.1 x10E3/uL  FSH  Result Value Ref Range   FSH 9.5 mIU/mL  LH  Result Value Ref Range   LH 6.2 mIU/mL      Assessment & Plan:   Problem List Items Addressed This Visit      Genitourinary   Complex cyst of left ovary    Followed by GYN, patient had 2nd [place]        Other   Family history of ovarian cancer    Seeing GYN, they are working with insurance company to see about getting BRCA testing      Pelvic fullness in female    Followed by GYN      Impingement syndrome, shoulder - Primary    May have element of rotator cuff injury; will try conservative measures, turmeric, ice, and refer to physical therapy; if not improving, she may call for referral to orthopaedist      Relevant Orders   Ambulatory referral to Physical Therapy    Other Visit Diagnoses    Shoulder pain, right        Relevant Orders    Ambulatory referral to Physical Therapy       Follow up plan: No Follow-up on file.  Face-to-face time with patient was more than 15 minutes, >50% time spent counseling and coordination of care

## 2015-07-20 NOTE — Assessment & Plan Note (Signed)
Seeing GYN, they are working with insurance company to see about getting BRCA testing

## 2015-07-28 ENCOUNTER — Telehealth: Payer: Self-pay | Admitting: Obstetrics and Gynecology

## 2015-07-28 NOTE — Telephone Encounter (Signed)
Hey this pt had gentic testing, got a letter from ins that they denied. / please call her. She wants to know if they have run the test yet and they are trying to get approval . B/c she can't afford.[facility] and [employer] [place].

## 2017-05-28 ENCOUNTER — Encounter: Payer: Self-pay | Admitting: Family Medicine

## 2017-05-28 ENCOUNTER — Ambulatory Visit (INDEPENDENT_AMBULATORY_CARE_PROVIDER_SITE_OTHER): Payer: BLUE CROSS/BLUE SHIELD | Admitting: Family Medicine

## 2017-05-28 VITALS — BP 132/72 | HR 95 | Temp 98.2°F | Resp 14 | Wt 157.0 lb

## 2017-05-28 DIAGNOSIS — M545 Low back pain: Secondary | ICD-10-CM

## 2017-05-28 DIAGNOSIS — E538 Deficiency of other specified B group vitamins: Secondary | ICD-10-CM | POA: Diagnosis not present

## 2017-05-28 DIAGNOSIS — R3129 Other microscopic hematuria: Secondary | ICD-10-CM | POA: Diagnosis not present

## 2017-05-28 DIAGNOSIS — Z1231 Encounter for screening mammogram for malignant neoplasm of breast: Secondary | ICD-10-CM

## 2017-05-28 DIAGNOSIS — E559 Vitamin D deficiency, unspecified: Secondary | ICD-10-CM

## 2017-05-28 DIAGNOSIS — R109 Unspecified abdominal pain: Secondary | ICD-10-CM | POA: Diagnosis not present

## 2017-05-28 DIAGNOSIS — Z1239 Encounter for other screening for malignant neoplasm of breast: Secondary | ICD-10-CM

## 2017-05-28 DIAGNOSIS — N83292 Other ovarian cyst, left side: Secondary | ICD-10-CM

## 2017-05-28 HISTORY — DX: Unspecified abdominal pain: R10.9

## 2017-05-28 LAB — POCT URINALYSIS DIPSTICK
Bilirubin, UA: NEGATIVE
GLUCOSE UA: NEGATIVE
Ketones, UA: NEGATIVE
Leukocytes, UA: NEGATIVE
Nitrite, UA: NEGATIVE
PH UA: 5 (ref 5.0–8.0)
SPEC GRAV UA: 1.02 (ref 1.010–1.025)
UROBILINOGEN UA: 0.2 U/dL

## 2017-05-28 NOTE — Assessment & Plan Note (Signed)
Check urine micro for crystals and blood, order CT scan of the abd/pelvis; check renal function

## 2017-05-28 NOTE — Assessment & Plan Note (Signed)
Patient was seen by GYN

## 2017-05-28 NOTE — Patient Instructions (Signed)
We'll get the labs and CT scan and go from there Call with any changes

## 2017-05-28 NOTE — Assessment & Plan Note (Signed)
Check levela nd replace 

## 2017-05-28 NOTE — Assessment & Plan Note (Signed)
Check level and supplement as indicated 

## 2017-05-28 NOTE — Progress Notes (Signed)
BP 132/72   Pulse 95   Temp 98.2 F (36.8 C) (Oral)   Resp 14   Wt 157 lb (71.2 kg)   SpO2 97%   BMI 25.34 kg/m    Subjective:    Patient ID: Cassidy Lewis, female    DOB: [birth year], 46 y.o.   MRN: [id number]  HPI: Cassidy Retortonya Kellett is a 46 y.o. female  Chief Complaint  Patient presents with  . Follow-up  . Back Pain    right lower    HPI Patient is here after a long interval, last visit was 2016 Health has been fine in the meantime over the last two years Some stress, few headaches She is having back pain; not sure if muscles in her back, maybe kidneys Fine all day long Around 4 am, it starts to hurt and bother her, just on the left side Doesn't matter if she gets up and goes to the bathroom Eases off when she gets up and around Has cut down on the sodas; more flavored water and cranberry juice to flush out her system Drinks 3 cans of Pepsi a day, 12 ounces each; trying to limit to two; coffee in the afternoon Does eat salt Some improvement with that cranberry Hx of recurrent UTI, but not as an adult No blood in the urine No discoloration, no unusual urine odor No fevers; sometimes has pressure when waiting a long time at work before she urinates; can go 8 hours between urination; 4 am until 12 noon because of being so busy Pain has been going on for about 4 months Thought it might have been the bed, slept on a heating pad, did not change No hx of kidney stones, either personally or in the family No weakness of the legs; pain a little more sharp in the morning, then fine after 30 minutes or so Had a complex cyst on the front, followed by GYN; it went away because they couldn't find it again No blood in the stool; father was diagnosed this year with stomach cancer; did chemo and now it's gone; he had multiple biopsies of colon, 1 of 12 came back positive Also has hx of low vit D; last level 26.5 Low B12 too  Depression screen PHQ 2/9 05/28/2017  Decreased  Interest 0  Down, Depressed, Hopeless 0  PHQ - 2 Score 0    Relevant past medical, surgical, family and social history reviewed Past Medical History:  Diagnosis Date  . Endometriosis   . Ovarian cyst   . Vitamin B12 deficiency   . Vitamin D deficiency disease    Past Surgical History:  Procedure Laterality Date  . ABDOMINAL HYSTERECTOMY     partial  . CESAREAN SECTION     x 3  . DILATION AND CURETTAGE OF UTERUS     Family History  Problem Relation Age of Onset  . Cancer Mother        ovarian  . Stroke Mother        possibly a small stroke  . Diabetes Father   . Hyperlipidemia Father   . Hypertension Father   . Cancer Father        stomach cancer  . Cancer Paternal Grandfather        lung cancer, died at age 47  . Diabetes Maternal Grandmother   . Gout Maternal Grandmother   . Stroke Sister        fraction strokes  . Endometriosis Sister   . Migraines Sister   .  Heart failure Paternal Grandmother   . COPD Neg Hx   . Heart disease Neg Hx    Social History   Social History  . Marital status: Married    Spouse name: N/A  . Number of children: N/A  . Years of education: N/A   Occupational History  . Not on file.   Social History Main Topics  . Smoking status: Never Smoker  . Smokeless tobacco: Never Used  . Alcohol use No     Comment: very rare  . Drug use: No  . Sexual activity: Yes    Birth control/ protection: None   Other Topics Concern  . Not on file   Social History Narrative  . No narrative on file  nonsmoker  Interim medical history since last visit reviewed. Allergies and medications reviewed  Review of Systems Per HPI unless specifically indicated above     Objective:    BP 132/72   Pulse 95   Temp 98.2 F (36.8 C) (Oral)   Resp 14   Wt 157 lb (71.2 kg)   SpO2 97%   BMI 25.34 kg/m   Wt Readings from Last 3 Encounters:  05/28/17 157 lb (71.2 kg)  07/20/15 149 lb (67.6 kg)  06/29/15 152 lb 11.2 oz (69.3 kg)    Physical  Exam  Constitutional: She appears well-developed and well-nourished. No distress.  Weight gain 12 pounds over last 22(+/-) months  Eyes: EOM are normal. No scleral icterus.  Neck: No thyromegaly present.  Cardiovascular: Normal rate.   Pulmonary/Chest: Effort normal.  Abdominal: Soft. Bowel sounds are normal. She exhibits no distension and no mass. There is no hepatosplenomegaly. There is no tenderness. There is no guarding.  Skin: She is not diaphoretic. No pallor.  Psychiatric: She has a normal mood and affect. Her behavior is normal. Judgment and thought content normal.   Results for orders placed or performed in visit on 05/28/17  POCT urinalysis dipstick  Result Value Ref Range   Color, UA yellow    Clarity, UA clear    Glucose, UA neg    Bilirubin, UA neg    Ketones, UA neg    Spec Grav, UA 1.020 1.010 - 1.025   Blood, UA trace    pH, UA 5.0 5.0 - 8.0   Protein, UA trace    Urobilinogen, UA 0.2 0.2 or 1.0 E.U./dL   Nitrite, UA neg    Leukocytes, UA Negative Negative      Assessment & Plan:   Problem List Items Addressed This Visit      Genitourinary   Complex cyst of left ovary    Patient was seen by GYN        Other   Vitamin D deficiency disease    Check level and supplement as indicated      Relevant Orders   VITAMIN D 25 Hydroxy (Vit-D Deficiency, Fractures)   Vitamin B12 deficiency    Check level and replace      Relevant Orders   B12   Left flank pain - Primary    Check urine micro for crystals and blood, order CT scan of the abd/pelvis; check renal function      Relevant Orders   CBC with Differential/Platelet   COMPLETE METABOLIC PANEL WITH GFR   CT Abdomen Pelvis W Contrast    Other Visit Diagnoses    Right low back pain, unspecified chronicity, with sciatica presence unspecified       entered by staff -- her pain  is actually on the LEFT, but I cannot correct the diagnosis since it is linked to a test that has already been resulted    Relevant Orders   POCT urinalysis dipstick (Completed)   Screening for breast cancer       Relevant Orders   MM Digital Screening   Other microscopic hematuria       Relevant Orders   CT Abdomen Pelvis W Contrast   Urinalysis, microscopic only       Follow up plan: Return in about 3 weeks (around 06/18/2017) for complete physical.  An after-visit summary was printed and given to the patient at check-out.  Please see the patient instructions which may contain other information and recommendations beyond what is mentioned above in the assessment and plan.  No orders of the defined types were placed in this encounter.   Orders Placed This Encounter  Procedures  . MM Digital Screening  . CT Abdomen Pelvis W Contrast  . CBC with Differential/Platelet  . COMPLETE METABOLIC PANEL WITH GFR  . VITAMIN D 25 Hydroxy (Vit-D Deficiency, Fractures)  . B12  . Urinalysis, microscopic only  . POCT urinalysis dipstick

## 2017-05-29 ENCOUNTER — Other Ambulatory Visit: Payer: Self-pay | Admitting: Family Medicine

## 2017-05-29 ENCOUNTER — Ambulatory Visit: Payer: Self-pay | Admitting: *Deleted

## 2017-05-29 ENCOUNTER — Telehealth: Payer: Self-pay | Admitting: Family Medicine

## 2017-05-29 LAB — CBC WITH DIFFERENTIAL/PLATELET
BASOS ABS: 38 {cells}/uL (ref 0–200)
Basophils Relative: 0.4 %
EOS ABS: 154 {cells}/uL (ref 15–500)
Eosinophils Relative: 1.6 %
HEMATOCRIT: 39.5 % (ref 35.0–45.0)
HEMOGLOBIN: 13.4 g/dL (ref 11.7–15.5)
LYMPHS ABS: 2563 {cells}/uL (ref 850–3900)
MCH: 29.3 pg (ref 27.0–33.0)
MCHC: 33.9 g/dL (ref 32.0–36.0)
MCV: 86.4 fL (ref 80.0–100.0)
MPV: 13.1 fL — ABNORMAL HIGH (ref 7.5–12.5)
Monocytes Relative: 6.2 %
NEUTROS ABS: 6250 {cells}/uL (ref 1500–7800)
Neutrophils Relative %: 65.1 %
Platelets: 225 10*3/uL (ref 140–400)
RBC: 4.57 10*6/uL (ref 3.80–5.10)
RDW: 11.6 % (ref 11.0–15.0)
Total Lymphocyte: 26.7 %
WBC mixed population: 595 cells/uL (ref 200–950)
WBC: 9.6 10*3/uL (ref 3.8–10.8)

## 2017-05-29 LAB — URINALYSIS, MICROSCOPIC ONLY: Hyaline Cast: NONE SEEN /LPF

## 2017-05-29 LAB — COMPLETE METABOLIC PANEL WITH GFR
AG Ratio: 1.4 (calc) (ref 1.0–2.5)
ALBUMIN MSPROF: 4.1 g/dL (ref 3.6–5.1)
ALKALINE PHOSPHATASE (APISO): 87 U/L (ref 33–115)
ALT: 20 U/L (ref 6–29)
AST: 18 U/L (ref 10–35)
BILIRUBIN TOTAL: 0.3 mg/dL (ref 0.2–1.2)
BUN: 17 mg/dL (ref 7–25)
CHLORIDE: 101 mmol/L (ref 98–110)
CO2: 27 mmol/L (ref 20–32)
CREATININE: 0.73 mg/dL (ref 0.50–1.10)
Calcium: 9.4 mg/dL (ref 8.6–10.2)
GFR, Est African American: 114 mL/min/{1.73_m2} (ref >=60)
GFR, Est Non African American: 98 mL/min/{1.73_m2} (ref >=60)
GLUCOSE: 86 mg/dL (ref 65–99)
Globulin: 3 g/dL (calc) (ref 1.9–3.7)
Potassium: 4.4 mmol/L (ref 3.5–5.3)
Sodium: 136 mmol/L (ref 135–146)
Total Protein: 7.1 g/dL (ref 6.1–8.1)

## 2017-05-29 LAB — VITAMIN B12: VITAMIN B 12: 952 pg/mL (ref 200–1100)

## 2017-05-29 LAB — VITAMIN D 25 HYDROXY (VIT D DEFICIENCY, FRACTURES): VIT D 25 HYDROXY: 41 ng/mL (ref 30–100)

## 2017-05-29 MED ORDER — NITROFURANTOIN MONOHYD MACRO 100 MG PO CAPS: 100.0000 mg | ORAL_CAPSULE | Freq: Two times a day (BID) | ORAL | 0 refills | Status: DC

## 2017-05-29 NOTE — Telephone Encounter (Signed)
Pt called in to confirm CT scan still needed since they took a culture in the office when she was there yesterday.  I checked her chart and there was nothing charted indicating that the CT scan was not needed.  It is still scheduled for Oct 30 at 3:00.   She wanted to be made aware if the CT scan order got cancelled because,  "It's really out of my budget if it's not needed".   I suggested she call the office back on Friday (Oct 05/31/17)  to follow up on culture results and confirm that the CT was still needed.  She acknowledged understanding and will call the office Friday.

## 2017-05-29 NOTE — Telephone Encounter (Signed)
Copied from CRM #[id number]. Topic: Quick Communication - See Telephone Encounter >> May 29, 2017  2:18 PM Cipriano BunkerLambe, Annette S wrote: CRM for notification. See Telephone encounter for:  05/29/17. Pt. Wants to know if she will still be having the CT scan. Scheduled for Oct. 30

## 2017-05-31 ENCOUNTER — Ambulatory Visit: Payer: Self-pay

## 2017-05-31 MED ORDER — LORAZEPAM 0.5 MG PO TABS: 0.2500 mg | ORAL_TABLET | Freq: Four times a day (QID) | ORAL | 0 refills | Status: DC | PRN

## 2017-05-31 NOTE — Telephone Encounter (Signed)
Phone call began speaking with husband who reported her physical complaint. I then asked if I could speak to the pt.  Pt stated that she took a dose of Nitrofurantoin this am @ 0915 and shortly after began having symptoms of feeling emotional "I'm crying at the drop of a hat", upsetting easy, feeling shaky and jittery and "extremely exhausted" and severe cotton mouth. She denies wheezing, edema or difficulty breathing. Advised pt to drink plenty of non caffeine fluids and to sit in a quiet place. Also advised to call back or call 911 if symptoms deteriorate. She is currently at work at [employer] [place]  Called Flow coordinator Limmie Patriciaassandra Graham at Dr. Marlise EvesLada's office and informed her of pt's symptoms. I was then transferred to [facility] RN and informed her of pt's concerns. Asher MuirJamie RN stated she will inform Dr. Sherie DonLada and will call the pt.  Reason for Disposition . Caller has URGENT medication question about med that PCP prescribed and triager unable to answer question  Answer Assessment - Initial Assessment Questions 1. SYMPTOMS: "Do you have any symptoms?"     Feels hot to the touch,cotton mouth, shaking and feel jittery, "cries at the drop of a hat" extremely fatigued 2. SEVERITY: If symptoms are present, ask "Are they mild, moderate or severe?" severe  Protocols used: MEDICATION QUESTION CALL-A-AH

## 2017-05-31 NOTE — Telephone Encounter (Signed)
I personally spoke with patient; mouth is extremely dry, trying to quench her thirst; trying to not cry Feels jittery No facial flushing; ronnie thought she felt warm to the touch This is total different Heart not racing No excessive sweating No burning with urination; urine did not smell funny; no pressure or pain We discussed; STOP macrobid No other antibiotics right now since micro showed wbc, but no LE on dip and may have been contaminated; could not do culture Few benzos to use over weekend Headaches in the background, just thought it was caffeine; just one Pepsi today; going on for about a week; just there CT scan is Tuesday which includes adrenals ----------------------------------------- Please add macrobid to adverse reactions and fax Rx to pharmacy; thank you

## 2017-06-03 ENCOUNTER — Telehealth: Payer: Self-pay | Admitting: Family Medicine

## 2017-06-03 DIAGNOSIS — R3129 Other microscopic hematuria: Secondary | ICD-10-CM

## 2017-06-03 DIAGNOSIS — R109 Unspecified abdominal pain: Secondary | ICD-10-CM

## 2017-06-03 DIAGNOSIS — R10A2 Flank pain, left side: Secondary | ICD-10-CM

## 2017-06-03 NOTE — Telephone Encounter (Signed)
Copied from CRM [id number]. Topic: Quick Communication - See Telephone Encounter >> Jun 03, 2017 12:06 PM Arlyss Gandyichardson, Layci Stenglein N, NT wrote: CRM for notification. See Telephone encounter for:  06/03/17. [facility] needing [facility] order changed to with and without. If any questions please call [phone]

## 2017-06-04 ENCOUNTER — Other Ambulatory Visit: Payer: Self-pay

## 2017-06-04 ENCOUNTER — Ambulatory Visit
Admission: RE | Admit: 2017-06-04 | Discharge: 2017-06-04 | Disposition: A | Discharge status: Home / Self Care | Payer: BLUE CROSS/BLUE SHIELD | Source: Ambulatory Visit | Attending: Family Medicine | Admitting: Family Medicine

## 2017-06-04 DIAGNOSIS — N83202 Unspecified ovarian cyst, left side: Secondary | ICD-10-CM | POA: Diagnosis not present

## 2017-06-04 DIAGNOSIS — R3129 Other microscopic hematuria: Secondary | ICD-10-CM | POA: Diagnosis not present

## 2017-06-04 DIAGNOSIS — R109 Unspecified abdominal pain: Secondary | ICD-10-CM

## 2017-06-04 MED ORDER — IOPAMIDOL (ISOVUE-300) INJECTION 61%
100.0000 mL | Freq: Once | INTRAVENOUS | Status: AC | PRN
  Administered 2017-06-04: 100 mL via INTRAVENOUS

## 2017-06-04 NOTE — Telephone Encounter (Signed)
That's fine; please order Thank you

## 2017-06-06 ENCOUNTER — Other Ambulatory Visit: Payer: Self-pay | Admitting: Family Medicine

## 2017-06-06 DIAGNOSIS — N83292 Other ovarian cyst, left side: Secondary | ICD-10-CM

## 2017-06-06 NOTE — Progress Notes (Signed)
Ordered US per radiology recs

## 2017-06-07 ENCOUNTER — Telehealth: Payer: Self-pay

## 2017-06-07 NOTE — Telephone Encounter (Signed)
-----   Message from Kerman PasseyMelinda P Lada, MD sent at 06/06/2017  6:19 PM EDT ----- Please let pt know that there is no evidence of cancer or anything worrisome on her kidneys; she does have multiple small cystic areas on her LEFT ovary, and the radiologist would like us to get a dedicated ultrasound to evaluate; thank you

## 2017-06-07 NOTE — Telephone Encounter (Signed)
Called pt no answer. LM for pt informing her of information below. To call back for questions or concerns. CRM created.

## 2017-06-12 ENCOUNTER — Ambulatory Visit
Admission: RE | Admit: 2017-06-12 | Discharge: 2017-06-12 | Disposition: A | Discharge status: Home / Self Care | Payer: BLUE CROSS/BLUE SHIELD | Source: Ambulatory Visit | Attending: Family Medicine | Admitting: Family Medicine

## 2017-06-12 DIAGNOSIS — Z90710 Acquired absence of both cervix and uterus: Secondary | ICD-10-CM | POA: Insufficient documentation

## 2017-06-12 DIAGNOSIS — N83202 Unspecified ovarian cyst, left side: Secondary | ICD-10-CM | POA: Diagnosis not present

## 2017-06-12 DIAGNOSIS — N83292 Other ovarian cyst, left side: Secondary | ICD-10-CM | POA: Diagnosis not present

## 2017-06-18 ENCOUNTER — Ambulatory Visit (INDEPENDENT_AMBULATORY_CARE_PROVIDER_SITE_OTHER): Payer: BLUE CROSS/BLUE SHIELD | Admitting: Family Medicine

## 2017-06-18 ENCOUNTER — Encounter: Payer: Self-pay | Admitting: Family Medicine

## 2017-06-18 DIAGNOSIS — N83292 Other ovarian cyst, left side: Secondary | ICD-10-CM

## 2017-06-18 DIAGNOSIS — Z0000 Encounter for general adult medical examination without abnormal findings: Secondary | ICD-10-CM

## 2017-06-18 NOTE — Assessment & Plan Note (Signed)
Refer to GYN; reviewed US report with patient

## 2017-06-18 NOTE — Patient Instructions (Signed)
We'll have you see the gynecologist

## 2017-06-18 NOTE — Assessment & Plan Note (Signed)
USPSTF grade A and B recommendations reviewed with patient; age-appropriate recommendations, preventive care, screening tests, etc discussed and encouraged; healthy living encouraged; see AVS for patient education given to patient  

## 2017-06-18 NOTE — Progress Notes (Signed)
Patient ID: Cassidy Lewis, female   DOB: [birth year], 46 y.o.   MRN: [id number]   Subjective:   Cassidy Lewis is a 46 y.o. female here for a complete physical exam  Interim issues since last visit: son is hospitalized; still having discomfort in the left side pelvis; off and on over the last several months; s/p hyst  USPSTF grade A and B recommendations Depression:  Depression screen [facility] 2/9 06/18/2017 05/28/2017  Decreased Interest 0 0  Down, Depressed, Hopeless 0 0  PHQ - 2 Score 0 0   Hypertension: BP Readings from Last 3 Encounters:  06/18/17 126/72  05/28/17 132/72  07/20/15 128/85   Obesity: Wt Readings from Last 3 Encounters:  06/18/17 154 lb 6.4 oz (70 kg)  05/28/17 157 lb (71.2 kg)  07/20/15 149 lb (67.6 kg)   BMI Readings from Last 3 Encounters:  06/18/17 24.92 kg/m  05/28/17 25.34 kg/m  07/20/15 24.05 kg/m    Skin cancer: nothing worrisome Lung cancer:  nonsmoker Breast cancer: no lumps Colorectal cancer: no fam hx of colon cancer  BRCA gene screening: family hx of breast and/or ovarian cancer and/or metastatic prostate cancer? Ovarian (mother), maternal cousin Cervical cancer screening: s/p hyst HIV, hep B, hep C: not intersted STD testing and prevention (chl/gon/syphilis): not intersted Intimate partner violence: no abuse Contraception: n/a Osteoporosis: n/a Fall prevention/vitamin D: discussed Diet: pretty good eater, lots of salads Exercise: walks on treadmill 3x a week, tv and music Alcohol: no Tobacco use: no Aspirin: n/a Lipids: not fasting No results found for: CHOL No results found for: HDL No results found for: LDLCALC No results found for: TRIG No results found for: CHOLHDL No results found for: LDLDIRECT Glucose:  Glucose  Date Value Ref Range Status  03/03/2015 91 65 - 99 mg/dL Final   Glucose, Bld  Date Value Ref Range Status  05/28/2017 86 65 - 99 mg/dL Final    Comment:    .            Fasting reference  interval .      Past Medical History:  Diagnosis Date  . Endometriosis   . Ovarian cyst   . Vitamin B12 deficiency   . Vitamin D deficiency disease    Past Surgical History:  Procedure Laterality Date  . ABDOMINAL HYSTERECTOMY     partial  . CESAREAN SECTION     x 3  . DILATION AND CURETTAGE OF UTERUS     Family History  Problem Relation Age of Onset  . Cancer Mother        ovarian  . Stroke Mother        possibly a small stroke  . Depression Mother        Bipolar  . Diabetes Father   . Hyperlipidemia Father   . Hypertension Father   . Cancer Father        stomach cancer  . Cancer Paternal Grandfather        lung cancer, died at age 3  . Diabetes Maternal Grandmother   . Gout Maternal Grandmother   . Stroke Sister        fraction strokes  . Endometriosis Sister   . Migraines Sister   . Heart failure Paternal Grandmother   . COPD Neg Hx   . Heart disease Neg Hx    Social History   Tobacco Use  . Smoking status: Never Smoker  . Smokeless tobacco: Never Used  Substance Use Topics  . Alcohol use: Yes  Comment: very rare   Review of Systems  Objective:   Vitals:   06/18/17 1449  BP: 126/72  Pulse: (!) 103  Resp: 14  Temp: 97.7 F (36.5 C)  TempSrc: Oral  SpO2: 98%  Weight: 154 lb 6.4 oz (70 kg)  Height: _0  (1.676 m)   Body mass index is 24.92 kg/m. Wt Readings from Last 3 Encounters:  06/18/17 154 lb 6.4 oz (70 kg)  05/28/17 157 lb (71.2 kg)  07/20/15 149 lb (67.6 kg)   Physical Exam  Constitutional: She appears well-developed and well-nourished.  HENT:  Head: Normocephalic and atraumatic.  Right Ear: Hearing, tympanic membrane, external ear and ear canal normal.  Left Ear: Hearing, tympanic membrane, external ear and ear canal normal.  Eyes: Conjunctivae and EOM are normal. Right eye exhibits no hordeolum. Left eye exhibits no hordeolum. No scleral icterus.  Neck: Carotid bruit is not present. No thyromegaly present.   Cardiovascular: Normal rate, regular rhythm, S1 normal, S2 normal and normal heart sounds.  No extrasystoles are present.  Pulmonary/Chest: Effort normal and breath sounds normal. No respiratory distress. Right breast exhibits no inverted nipple, no mass, no nipple discharge, no skin change and no tenderness. Left breast exhibits no inverted nipple, no mass, no nipple discharge, no skin change and no tenderness. Breasts are symmetrical.  Abdominal: Soft. Normal appearance and bowel sounds are normal. She exhibits no distension, no abdominal bruit, no pulsatile midline mass and no mass. There is no hepatosplenomegaly. There is no tenderness. No hernia.  Musculoskeletal: Normal range of motion. She exhibits no edema.  Lymphadenopathy:       Head (right side): No submandibular adenopathy present.       Head (left side): No submandibular adenopathy present.    She has no cervical adenopathy.    She has no axillary adenopathy.  Neurological: She is alert. She displays no tremor. No cranial nerve deficit. She exhibits normal muscle tone. Gait normal.  Reflex Scores:      Patellar reflexes are 2+ on the right side and 2+ on the left side. Skin: Skin is warm and dry. No bruising and no ecchymosis noted. No cyanosis. No pallor.  Psychiatric: Her speech is normal and behavior is normal. Thought content normal. Her mood appears not anxious. She does not exhibit a depressed mood.    Assessment/Plan:   Problem List Items Addressed This Visit      Genitourinary   Complex cyst of left ovary    Refer to GYN; reviewed [place] report with patient      Relevant Orders   Ambulatory referral to Gynecology     Other   Preventative health care    USPSTF grade A and B recommendations reviewed with patient; age-appropriate recommendations, preventive care, screening tests, etc discussed and encouraged; healthy living encouraged; see AVS for patient education given to patient          No orders of the defined  types were placed in this encounter.  Orders Placed This Encounter  Procedures  . Ambulatory referral to Gynecology    Referral Priority:   Routine    Referral Type:   Consultation    Referral Reason:   Specialty Services Required    Requested Specialty:   Gynecology    Number of Visits Requested:   1    Follow up plan: Return in about 1 year (around 06/18/2018) for complete physical.  An After Visit Summary was printed and given to the patient.

## 2017-09-17 ENCOUNTER — Ambulatory Visit: Payer: BLUE CROSS/BLUE SHIELD | Admitting: Obstetrics and Gynecology

## 2017-09-17 ENCOUNTER — Encounter: Payer: Self-pay | Admitting: Obstetrics and Gynecology

## 2017-09-17 VITALS — BP 121/69 | HR 83 | Ht 68.0 in | Wt 155.6 lb

## 2017-09-17 DIAGNOSIS — N83202 Unspecified ovarian cyst, left side: Secondary | ICD-10-CM

## 2017-09-17 DIAGNOSIS — Z809 Family history of malignant neoplasm, unspecified: Secondary | ICD-10-CM

## 2017-09-17 DIAGNOSIS — Z8742 Personal history of other diseases of the female genital tract: Secondary | ICD-10-CM

## 2017-09-17 NOTE — Progress Notes (Signed)
GYNECOLOGY PROGRESS NOTE  Subjective:    Patient ID: Cassidy Lewis, female    DOB: [birth year], 47 y.o.   MRN: [id number]  HPI  Patient is a 47 y.o. [phone] [id number] female who [facility] ovarian [facility] and pelvic discomfort.  Patient notes that the pain has resolved since her visit with her PCP (was diagnosed with a UTI), however it was intermittent, radiating to left flank.  Patient notes that she has a h/o ovarian cysts, and had not had any f/u since 2016.  Recent CT scan in October 2018 (performed to r/o kidney stones) noted an ovarian mass again on the left, which was followed by an ultrasound in November 2018 which further delineate the cyst.  Patient notes that overall she has not had very much discomfort until recently.  She does note concern regarding the cyst due to her mother's history of ovarian cancer diagnosed 10 years ago, especially since her mother has had a recurrence of the cancer diagnosed last week and is starting treatment.  She also notes that her father was diagnosed stomach cancer within the past few months.   The following portions of the patient's history were reviewed and updated as appropriate: allergies, current medications, past family history, past medical history, past social history, past surgical history and problem list.  Review of Systems Pertinent items noted in HPI and remainder of comprehensive ROS otherwise negative.   Objective:   Blood pressure 121/69, pulse 83, height 5\' 8"  (1.727 m), weight 155 lb 9.6 oz (70.6 kg). General appearance: alert and no distress Abdomen: soft, non-tender; bowel sounds normal; no masses,  no organomegaly Pelvic: deferred Remainder of exam deferred.     Imaging:  CLINICAL DATA:  Complex cyst of the left ovary. Hysterectomy 15 years ago.  EXAM: TRANSABDOMINAL AND TRANSVAGINAL ULTRASOUND OF PELVIS  TECHNIQUE: Both transabdominal and transvaginal ultrasound examinations  of the pelvis were performed. Transabdominal technique was performed for global imaging of the pelvis including uterus, ovaries, adnexal regions, and pelvic cul-de-sac. It was necessary to proceed with endovaginal exam following the transabdominal exam to visualize the ovaries.  COMPARISON:  CT of the abdomen and pelvis on 06/04/2017  FINDINGS: Uterus  Measurements: Surgically absent. Vaginal cuff is unremarkable in appearance.  Endometrium  Thickness: Surgically absent uterus.  Right ovary  Measurements: 2.3 x 1.6 x 2.0 cm. Multiple hyperechoic foci are identified throughout the right ovary. No suspicious mass.  Left ovary  Measurements: 4.9 x 2.1 x 3.1 cm. An anechoic cyst is 2.5 x 2.6 x 2.5 cm. A circumscribed mildly heterogeneous hypoechoic mass is 2.1 x 2.1 x 2.1 cm. Blood flow is identified within the ovarian parenchyma.  Other findings  No abnormal free fluid.  IMPRESSION: 1. Prior hysterectomy.  Unremarkable vaginal cuff. 2. 2.5 cm benign-appearing anechoic left ovarian cyst adjacent to a 2.1 cm benign-appearing hypoechoic left ovarian cyst. Given the patient's pain, followup is indicated. There is no evidence for torsion. 3. Follow-up pelvic ultrasound is recommended in 8-12 weeks.    CLINICAL DATA:  47 year old female with daily left flank pain maximal in the mornings for the past 4 months. No known injury.  EXAM: CT ABDOMEN AND PELVIS WITH CONTRAST  TECHNIQUE: Multidetector CT imaging of the abdomen and pelvis was performed using the standard protocol following bolus administration of intravenous contrast.  CONTRAST:  100mL ISOVUE-300 IOPAMIDOL (ISOVUE-300) INJECTION 61%  COMPARISON:  Pelvis ultrasound 03/11/2015.  FINDINGS: Lower chest: Normal lung bases.  No  pericardial or pleural effusion.  Hepatobiliary: Benign-appearing 16 mm area of hepatic arterial phase vascularity at the right liver dome (series 2, image 9),  normal variant. Normal liver parenchyma elsewhere. Negative gallbladder. No biliary ductal enlargement.  Pancreas: Normal.  Spleen: Normal.  Adrenals/Urinary Tract: Normal adrenal glands.  Bilateral renal enhancement and contrast excretion is symmetric and within normal limits. Mild fetal lobulation is likely a normal variant, no definite renal cortical scarring. No nephrolithiasis. No perinephric stranding. No hydronephrosis or hydroureter. Negative course of both ureters.  Unremarkable urinary bladder.  Stomach/Bowel: Negative rectum. Negative sigmoid and descending colon aside from retained stool. Oral contrast has reached the splenic flexure. There is contrast mixed with stool in the transverse and right colon. Normal appendix. Negative terminal ileum. No dilated small bowel. Negative stomach and duodenum.  No abdominal free fluid.  Vascular/Lymphatic: Major arterial structures are normal. Portal venous system is patent.  Abdominal and pelvic lymph nodes are within normal limits.  Reproductive: The uterus is surgically absent. The right ovary is within normal limits.  The left ovary demonstrates multiple less than 2 cm low to intermediate density cystic appearing areas (series 2, images 72-74).  Other: No pelvic free fluid.  Musculoskeletal: No acute osseous abnormality identified.  IMPRESSION: 1. Negative CT abdomen. Both kidneys and ureters appear within normal limits. 2. Multiple small cystic areas of the left ovary are probably physiologic. Follow-up Pelvis Ultrasound (transabdominal and transvaginal) would likely confirm benignity, and could be compared to that on 03/11/2015. 3. Otherwise negative CT pelvis; surgically absent uterus.   Assessment:   Left ovarian cyst H/o ovarian cysts Family h/o cancer (ovarian and stomach) Pelvic pain, resolved  Plan:   - Left ovarian with 2 cysts, ~` 2 cm each in size. Review of ultrasound from  2016 noted a 1.3 cm cyst. It is unclear if this is the same cyst from 2016 or if new cysts have developed.  Although cysts appear benign, due to patient's age and family history, I recommend performing a CA-125.  In addition, recommendations from radiologist include f/u ultrasound in 8-12 weeks, which would be approximately now, so will order ultrasound. Patient is no longer having pelvic pain after being diagnosed and treated for a UTI.  - Family h/o cancer (mom with h/o ovarian cancer now with recurrence, and dad with newly diagnosed stomach cancer).  Discussed hereditary cancer screening.  Patient notes when she tried to have it done a few years ago, her insurance would not pay for it. Discussed that now she has additional factors, as well as a new insurance carrier, and that she would more than likely qualify at this time.  Also discussed out of pocket cost for testing.  Given handouts on testing.  Patient notes that she will check with her new insurance company regarding the testing.  - Pelvic pain has resolved with treatment of her UTI.    A total of 15 minutes were spent face-to-face with the patient during this encounter and over half of that time dealt with counseling and coordination of care.   Hildred Laser, MD [facility]

## 2017-09-17 NOTE — Progress Notes (Signed)
Pt has ovarian cysts and rarely have pain from them.

## 2017-09-17 NOTE — Patient Instructions (Signed)
BRCA Gene Testing Why am I having this test? BRCA gene testing is done to check for the presence of harmful changes (mutations) in the BRCA1 gene or the BRCA2 gene (breast cancer susceptibility genes). If there is a mutation, the genes may not be able to help repair damaged cells in the body. As a result, the damaged cells may develop defects that can lead to certain types of cancer. You may have this test if you have a family history of certain types of cancer, including cancer of the:  Breast.  Ovaries.  Fallopian tubes.  Peritoneum.  Pancreas.  Prostate.  What kind of sample is taken? The test requires either a sample of blood or a sample of cells from your saliva. If a sample of blood is needed, it will probably be collected by inserting a needle into a vein. If a sample of saliva is needed, you will get instructions about how to collect the sample. What do the results mean? The test results can show whether you have a mutation in the BRCA1 or BRCA2 gene that increases your risk for certain cancers. Meaning of negative test results A negative test result means that you do not have a mutation in the BRCA1 or BRCA2 gene that is known to increase your risk for certain cancers. This does not mean that you will never get cancer. Talk with your health care provider or a genetic counselor about what this result means for you. Meaning of positive test results A positive test result means that you have a mutation in the BRCA1 or BRCA2 gene that increases your risk for certain cancers. Women with a positive test result have an increased risk for breast and ovarian cancer. Both women and men with a mutation have an increased risk for breast cancer and may be at greater risk for other types of cancer. Getting a positive test result does not mean that you will develop cancer. Talk with your health care provider or a genetic counselor about what this result means for you. You may be told that you are  a carrier. This means that you can pass the mutation to your children. Meaning of ambiguous test results Ambiguous, inconclusive, or uncertain test results mean that there is a change in the BRCA1 or BRCA2 gene, but it is a change that has not been linked to cancer. Talk with your health care provider or a genetic counselor about what this result means for you. Talk with your health care provider to discuss your results, treatment options, and if necessary, the need for more tests. Talk with your health care provider if you have any questions about your results. How do I get my results? It is up to you to get your test results. Ask your health care provider, or the department that is doing the test, when your results will be ready. This information is not intended to replace advice given to you by your health care provider. Make sure you discuss any questions you have with your health care provider. Document Released: 08/16/2004 Document Revised: 03/26/2016 Document Reviewed: 03/14/2016 Elsevier Interactive Patient Education  2018 [employer]  

## 2017-09-23 ENCOUNTER — Ambulatory Visit (INDEPENDENT_AMBULATORY_CARE_PROVIDER_SITE_OTHER): Payer: BLUE CROSS/BLUE SHIELD

## 2017-09-23 DIAGNOSIS — N83202 Unspecified ovarian cyst, left side: Secondary | ICD-10-CM | POA: Diagnosis not present

## 2017-09-23 DIAGNOSIS — Z809 Family history of malignant neoplasm, unspecified: Secondary | ICD-10-CM

## 2017-10-04 DIAGNOSIS — M25512 Pain in left shoulder: Secondary | ICD-10-CM | POA: Diagnosis not present

## 2017-10-11 DIAGNOSIS — M25512 Pain in left shoulder: Secondary | ICD-10-CM | POA: Diagnosis not present

## 2017-10-18 DIAGNOSIS — S46812D Strain of other muscles, fascia and tendons at shoulder and upper arm level, left arm, subsequent encounter: Secondary | ICD-10-CM | POA: Diagnosis not present

## 2017-10-30 DIAGNOSIS — S46812D Strain of other muscles, fascia and tendons at shoulder and upper arm level, left arm, subsequent encounter: Secondary | ICD-10-CM | POA: Diagnosis not present

## 2017-11-13 DIAGNOSIS — S46812D Strain of other muscles, fascia and tendons at shoulder and upper arm level, left arm, subsequent encounter: Secondary | ICD-10-CM | POA: Diagnosis not present

## 2017-11-13 DIAGNOSIS — M25512 Pain in left shoulder: Secondary | ICD-10-CM | POA: Diagnosis not present

## 2017-11-27 DIAGNOSIS — M25512 Pain in left shoulder: Secondary | ICD-10-CM | POA: Diagnosis not present

## 2017-11-27 DIAGNOSIS — B9689 Other specified bacterial agents as the cause of diseases classified elsewhere: Secondary | ICD-10-CM | POA: Diagnosis not present

## 2017-11-27 DIAGNOSIS — J019 Acute sinusitis, unspecified: Secondary | ICD-10-CM | POA: Diagnosis not present

## 2017-11-27 DIAGNOSIS — J209 Acute bronchitis, unspecified: Secondary | ICD-10-CM | POA: Diagnosis not present

## 2018-05-14 ENCOUNTER — Ambulatory Visit: Payer: BLUE CROSS/BLUE SHIELD | Admitting: Nurse Practitioner

## 2018-05-14 ENCOUNTER — Encounter: Payer: Self-pay | Admitting: Nurse Practitioner

## 2018-05-14 VITALS — BP 130/90 | HR 90 | Temp 97.4°F | Resp 16 | Ht 68.0 in | Wt 157.1 lb

## 2018-05-14 DIAGNOSIS — J028 Acute pharyngitis due to other specified organisms: Secondary | ICD-10-CM

## 2018-05-14 DIAGNOSIS — J029 Acute pharyngitis, unspecified: Secondary | ICD-10-CM | POA: Diagnosis not present

## 2018-05-14 DIAGNOSIS — H6121 Impacted cerumen, right ear: Secondary | ICD-10-CM | POA: Diagnosis not present

## 2018-05-14 LAB — POCT RAPID STREP A (OFFICE): RAPID STREP A SCREEN: NEGATIVE

## 2018-05-14 MED ORDER — MAGIC MOUTHWASH W/LIDOCAINE: 5.0000 mL | Freq: Four times a day (QID) | ORAL | 0 refills | Status: DC | PRN

## 2018-05-14 MED ORDER — AZITHROMYCIN 250 MG PO TABS: ORAL_TABLET | ORAL | 0 refills | Status: DC

## 2018-05-14 NOTE — Progress Notes (Signed)
Name: Cassidy Lewis   MRN: [id number]    DOB: 1971/01/29   Date:05/14/2018       Progress Note  Subjective  Chief Complaint  Chief Complaint  Patient presents with  . Sore Throat  . Otitis Media    HPI  Patient presents with sore throat started last week but started to improve over the weekend then it got worse than it was the first time on Monday and sore throat is progressively worsened since then states neck feel sore and right ear pain started today.  No known sick contacts.   Patient Active Problem List   Diagnosis Date Noted  . Left flank pain 05/28/2017  . Impingement syndrome, shoulder 07/20/2015  . Perimenopausal vasomotor symptoms 06/10/2015  . Abnormal mammogram of right breast 03/24/2015  . Complex cyst of left ovary 03/14/2015  . Breast tenderness in female 03/03/2015  . Family history of ovarian cancer 03/03/2015  . Preventative health care 03/03/2015  . Pelvic fullness in female 03/03/2015  . Vitamin D deficiency disease   . Vitamin B12 deficiency     Past Medical History:  Diagnosis Date  . Endometriosis   . Ovarian cyst   . Vitamin B12 deficiency   . Vitamin D deficiency disease     Past Surgical History:  Procedure Laterality Date  . ABDOMINAL HYSTERECTOMY     partial  . CESAREAN SECTION     x 3  . DILATION AND CURETTAGE OF UTERUS      Social History   Tobacco Use  . Smoking status: Never Smoker  . Smokeless tobacco: Never Used  Substance Use Topics  . Alcohol use: No    Frequency: Never    Comment: very rare     Current Outpatient Medications:  .  Ascorbic Acid (VITAMIN C ADULT GUMMIES PO), Take by mouth daily., Disp: , Rfl:  .  Cholecalciferol (VITAMIN D3) 5000 UNITS TABS, Take 5,000 Units by mouth daily., Disp: , Rfl:  .  Multiple Vitamins-Minerals (HAIR SKIN AND NAILS FORMULA PO), Take by mouth daily., Disp: , Rfl:  .  vitamin B-12 (CYANOCOBALAMIN) 250 MCG tablet, Take 250 mcg by mouth daily., Disp: , Rfl:  .  VITAMIN E PO,  Take 500 mg by mouth daily., Disp: , Rfl:   Allergies  Allergen Reactions  . Coconut Oil Swelling  . Penicillins Anaphylaxis  . Macrobid [Nitrofurantoin]     Review of Systems  Constitutional: Positive for chills (states always cold) and malaise/fatigue. Negative for fever.  HENT: Positive for ear pain and sore throat. Negative for congestion, ear discharge, nosebleeds, sinus pain and tinnitus.   Eyes: Negative for discharge and redness.  Respiratory: Negative for cough, shortness of breath and stridor.   Cardiovascular: Negative for chest pain and palpitations.  Musculoskeletal: Negative for back pain and myalgias.  Skin: Negative for itching and rash.     No other specific complaints in a complete review of systems (except as listed in HPI above).  Objective  Vitals:   05/14/18 1349  BP: 130/90  Pulse: 90  Resp: 16  Temp: (!) 97.4 F (36.3 C)  TempSrc: Oral  SpO2: 100%  Weight: 157 lb 1.6 oz (71.3 kg)  Height: 5\' 8"  (1.727 m)      Body mass index is 23.89 kg/m.  Nursing Note and Vital Signs reviewed.  Physical Exam  Constitutional: She is oriented to person, place, and time. She appears well-developed and well-nourished.  HENT:  Head: Normocephalic and atraumatic.  Right Ear: Hearing and  tympanic membrane normal. There is tenderness (impacted with cerumen ).  Left Ear: Hearing and tympanic membrane normal.  Mouth/Throat: Uvula is midline and mucous membranes are normal. Uvula swelling present. Posterior oropharyngeal edema and posterior oropharyngeal erythema present. No oropharyngeal exudate. Tonsils are 3+ on the right. Tonsils are 3+ on the left.  Eyes: Pupils are equal, round, and reactive to light. EOM are normal.  Neck: Normal range of motion. Neck supple.  Cardiovascular: Normal rate and regular rhythm.  Pulmonary/Chest: Effort normal and breath sounds normal.  Lymphadenopathy:    She has cervical adenopathy.  Neurological: She is alert and oriented  to person, place, and time.  Skin: Skin is warm and dry. No erythema.  Psychiatric: She has a normal mood and affect. Her behavior is normal.     No results found for this or any previous visit (from the past 48 hour(s)).  Assessment & Plan  1. Acute pharyngitis due to other specified organisms Despite hoarseness patient symptoms had abrupt onset, had strawberry tongue and largely swollen tonsils without cough and nasal congestion. Discussed risks and benefits of antibiotics- plan to wait for culture before starting antibiotics but if symptoms rapidly worsen can start taking then. Discussed viral vs bacterial etiology in detail. And OTC remedies.  - POCT rapid strep A - azithromycin (ZITHROMAX) 250 MG tablet; 2 tablets day one and then 1 tablet for the next 3 days.  Dispense: 6 tablet; Refill: 0 - Culture, Group A Strep - magic mouthwash w/lidocaine SOLN; Take 5 mLs by mouth 4 (four) times daily as needed for mouth pain.  Dispense: 150 mL; Refill: 0  2. Impacted cerumen of right ear - Ear Lavage

## 2018-05-14 NOTE — Patient Instructions (Addendum)
  For throat pain: - Can try honey lemon tea - Chloraseptic sore throat spray  - Will send rx for magic mouth wash to soothe- gargle and spit  Pharyngitis Pharyngitis is a sore throat (pharynx). There is redness, pain, and swelling of your throat. Follow these instructions at home:  Drink enough fluids to keep your pee (urine) clear or pale yellow.  Only take medicine as told by your doctor. ? You may get sick again if you do not take medicine as told. Finish your medicines, even if you start to feel better. ? Do not take aspirin.  Rest.  Rinse your mouth (gargle) with salt water ( tsp of salt per 1 qt of water) every 1-2 hours. This will help the pain.  If you are not at risk for choking, you can suck on hard candy or sore throat lozenges. Contact a doctor if:  You have large, tender lumps on your neck.  You have a rash.  You cough up green, yellow-brown, or bloody spit. Get help right away if:  You have a stiff neck.  You drool or cannot swallow liquids.  You throw up (vomit) or are not able to keep medicine or liquids down.  You have very bad pain that does not go away with medicine.  You have problems breathing (not from a stuffy nose). This information is not intended to replace advice given to you by your health care provider. Make sure you discuss any questions you have with your health care provider. Document Released: 01/09/2008 Document Revised: 12/29/2015 Document Reviewed: 03/30/2013 Elsevier Interactive Patient Education  2017 [employer].

## 2018-05-16 LAB — CULTURE, GROUP A STREP
MICRO NUMBER: 91215999
SPECIMEN QUALITY:: ADEQUATE

## 2018-07-25 ENCOUNTER — Ambulatory Visit: Payer: BLUE CROSS/BLUE SHIELD | Admitting: Family Medicine

## 2018-07-25 ENCOUNTER — Encounter: Payer: Self-pay | Admitting: Family Medicine

## 2018-07-25 VITALS — BP 128/76 | HR 90 | Temp 97.9°F | Resp 16 | Ht 68.0 in | Wt 157.0 lb

## 2018-07-25 DIAGNOSIS — Z0000 Encounter for general adult medical examination without abnormal findings: Secondary | ICD-10-CM | POA: Diagnosis not present

## 2018-07-25 DIAGNOSIS — R454 Irritability and anger: Secondary | ICD-10-CM

## 2018-07-25 DIAGNOSIS — Z1239 Encounter for other screening for malignant neoplasm of breast: Secondary | ICD-10-CM

## 2018-07-25 DIAGNOSIS — R61 Generalized hyperhidrosis: Secondary | ICD-10-CM | POA: Diagnosis not present

## 2018-07-25 DIAGNOSIS — E559 Vitamin D deficiency, unspecified: Secondary | ICD-10-CM

## 2018-07-25 DIAGNOSIS — Z6379 Other stressful life events affecting family and household: Secondary | ICD-10-CM | POA: Diagnosis not present

## 2018-07-25 HISTORY — DX: Irritability and anger: R45.4

## 2018-07-25 NOTE — Progress Notes (Signed)
BP 128/76 (BP Location: Left Arm, Patient Position: Sitting, Cuff Size: Normal)   Pulse 90   Temp 97.9 F (36.6 C) (Oral)   Resp 16   Ht 5\' 8"  (1.727 m)   Wt 157 lb (71.2 kg)   SpO2 99%   BMI 23.87 kg/m    Subjective:    Patient ID: Cassidy Lewis, female    DOB: 08-06-1971, 47 y.o.   MRN: [id number]  HPI: Cassidy Lewis is a 47 y.o. female  Chief Complaint  Patient presents with  . Follow-up    patient stated that she just wanted to see her doctor and get a check-up.   . Menopause    patient questions is she is starting menopause: mood swings, hot flashes & night sweats  . Labs Only    HPI She has a lot of stress in her life right now Her mother's treatments are not working for her cancer; she stopped chemo; she has ovarian cancer Her father's cancer is getting worse too; he has stomach cancer; they are both in [place] She cares for her father-in-law She is trying to work Had a wedding she planned in October and now planning another wedding (he daughters) A lot going on Things are now irritating her; really nagging at her; some days she just stays in a foul mood all day long Emotions are on a roller coaster; gets upset; going on for a while, months and months ago She is having night sweats at times; wondering if she is going through menopause Her mother went through menopause around age 36; one sister is 3 years older than patient, but had hyst and BSO for endometriosis She has had a partial hysterectomy but still ovaries, over 10 years ago Constantly tired; a little scratchy throat because of the weather; constantly drinking liquids, not sure if nervous Vitamin D was low in 2016, 26.4 Vitamin B12 was normal in 2016  Depression screen [facility] 2/9 07/25/2018 06/18/2017 05/28/2017  Decreased Interest 0 0 0  Down, Depressed, Hopeless 0 0 0  PHQ - 2 Score 0 0 0  Altered sleeping 0 - -  Tired, decreased energy 0 - -  Change in appetite 0 - -  Feeling bad or failure  about yourself  0 - -  Trouble concentrating 0 - -  Moving slowly or fidgety/restless 0 - -  Suicidal thoughts 0 - -  PHQ-9 Score 0 - -  Difficult doing work/chores Not difficult at all - -   Fall Risk  07/25/2018 05/14/2018 06/18/2017 05/28/2017  Falls in the past year? 0 No No No  Number falls in past yr: 0 - - -  Injury with Fall? 0 - - -    Relevant past medical, surgical, family and social history reviewed Past Medical History:  Diagnosis Date  . Endometriosis   . Ovarian cyst   . Vitamin B12 deficiency   . Vitamin D deficiency disease    Past Surgical History:  Procedure Laterality Date  . ABDOMINAL HYSTERECTOMY     partial  . CESAREAN SECTION     x 3  . DILATION AND CURETTAGE OF UTERUS     Family History  Problem Relation Age of Onset  . Cancer Mother        ovarian  . Stroke Mother        possibly a small stroke  . Depression Mother        Bipolar  . Diabetes Father   . Hyperlipidemia Father   .  Hypertension Father   . Cancer Father        stomach cancer  . Cancer Paternal Grandfather        lung cancer, died at age 48  . Diabetes Maternal Grandmother   . Gout Maternal Grandmother   . Stroke Sister        fraction strokes  . Endometriosis Sister   . Migraines Sister   . Heart failure Paternal Grandmother   . COPD Neg Hx   . Heart disease Neg Hx    Social History   Tobacco Use  . Smoking status: Never Smoker  . Smokeless tobacco: Never Used  Substance Use Topics  . Alcohol use: No    Frequency: Never    Comment: very rare  . Drug use: No     [facility] Visit from 07/25/2018 [facility]  AUDIT-C Score  1      Interim medical history since last visit reviewed. Allergies and medications reviewed  Review of Systems Per HPI unless specifically indicated above     Objective:    BP 128/76 (BP Location: Left Arm, Patient Position: Sitting, Cuff Size: Normal)   Pulse 90   Temp 97.9 F (36.6 C) (Oral)   Resp 16   Ht 5'  8" (1.727 m)   Wt 157 lb (71.2 kg)   SpO2 99%   BMI 23.87 kg/m   Wt Readings from Last 3 Encounters:  07/25/18 157 lb (71.2 kg)  05/14/18 157 lb 1.6 oz (71.3 kg)  09/17/17 155 lb 9.6 oz (70.6 kg)    Physical Exam Constitutional:      Appearance: Normal appearance. She is well-developed.  Eyes:     General: No scleral icterus. Neck:     Thyroid: No thyromegaly.  Cardiovascular:     Rate and Rhythm: Normal rate and regular rhythm.  Pulmonary:     Effort: Pulmonary effort is normal.     Breath sounds: Normal breath sounds.  Neurological:     Mental Status: She is alert.  Psychiatric:        Mood and Affect: Mood is not anxious or depressed.        Behavior: Behavior normal.        Thought Content: Thought content does not include homicidal or suicidal ideation.     Results for orders placed or performed in visit on 05/14/18  Culture, Group A Strep  Result Value Ref Range   MICRO NUMBER: [id number]    SPECIMEN QUALITY: ADEQUATE    SOURCE: THROAT    STATUS: FINAL    RESULT: No group A Streptococcus isolated   POCT rapid strep A  Result Value Ref Range   Rapid Strep A Screen Negative Negative      Assessment & Plan:   Problem List Items Addressed This Visit      Other   Preventative health care   Relevant Orders   TSH   CBC with Differential/Platelet   COMPLETE METABOLIC PANEL WITH GFR   Lipid panel   Vitamin D deficiency disease    Check level, supplement if needed      Irritability - Primary    Will check labs to see if hormonal, thyroid, vitamin D deficiency, etc; patient politely declined medicine; no SI/HI; offered counseling, but she declined       Other Visit Diagnoses    Night sweats       may be hormonal; will start with labs; close f/u   Relevant Orders  LH   FSH   Stress due to illness of family member       supportive listening provided; she does not want counseling; does not want SSRI (no medicine)   Vitamin D deficiency       Relevant  Orders   VITAMIN D 25 Hydroxy (Vit-D Deficiency, Fractures)   Screening for breast cancer       Relevant Orders   MM 3D SCREEN BREAST BILATERAL       Follow up plan: Return in about 4 weeks (around 08/22/2018) for complete physical.  An after-visit summary was printed and given to the patient at check-out.  Please see the patient instructions which may contain other information and recommendations beyond what is mentioned above in the assessment and plan.  No orders of the defined types were placed in this encounter.   Orders Placed This Encounter  Procedures  . MM 3D SCREEN BREAST BILATERAL  . LH  . FSH  . TSH  . VITAMIN D 25 Hydroxy (Vit-D Deficiency, Fractures)  . CBC with Differential/Platelet  . COMPLETE METABOLIC PANEL WITH GFR  . Lipid panel

## 2018-07-25 NOTE — Assessment & Plan Note (Signed)
Will check labs to see if hormonal, thyroid, vitamin D deficiency, etc; patient politely declined medicine; no SI/HI; offered counseling, but she declined

## 2018-07-25 NOTE — Patient Instructions (Addendum)
Let's get labs today If you have not heard anything from my staff in a week about any orders/referrals/studies from today, please contact us here to follow-up [phone]  Return in 4-6 weeks for a physical

## 2018-07-25 NOTE — Assessment & Plan Note (Signed)
Check level, supplement if needed 

## 2018-07-26 LAB — CBC WITH DIFFERENTIAL/PLATELET
ABSOLUTE MONOCYTES: 455 {cells}/uL (ref 200–950)
BASOS PCT: 0.4 %
Basophils Absolute: 28 cells/uL (ref 0–200)
EOS ABS: 161 {cells}/uL (ref 15–500)
Eosinophils Relative: 2.3 %
HEMATOCRIT: 39.3 % (ref 35.0–45.0)
HEMOGLOBIN: 13.6 g/dL (ref 11.7–15.5)
LYMPHS ABS: 2100 {cells}/uL (ref 850–3900)
MCH: 29.9 pg (ref 27.0–33.0)
MCHC: 34.6 g/dL (ref 32.0–36.0)
MCV: 86.4 fL (ref 80.0–100.0)
MPV: 12.2 fL (ref 7.5–12.5)
Monocytes Relative: 6.5 %
NEUTROS ABS: 4256 {cells}/uL (ref 1500–7800)
Neutrophils Relative %: 60.8 %
Platelets: 273 10*3/uL (ref 140–400)
RBC: 4.55 10*6/uL (ref 3.80–5.10)
RDW: 11.8 % (ref 11.0–15.0)
Total Lymphocyte: 30 %
WBC: 7 10*3/uL (ref 3.8–10.8)

## 2018-07-26 LAB — COMPLETE METABOLIC PANEL WITH GFR
AG Ratio: 1.3 (calc) (ref 1.0–2.5)
ALKALINE PHOSPHATASE (APISO): 90 U/L (ref 33–115)
ALT: 22 U/L (ref 6–29)
AST: 23 U/L (ref 10–35)
Albumin: 4.1 g/dL (ref 3.6–5.1)
BUN: 11 mg/dL (ref 7–25)
CO2: 28 mmol/L (ref 20–32)
Calcium: 9.8 mg/dL (ref 8.6–10.2)
Chloride: 105 mmol/L (ref 98–110)
Creat: 0.7 mg/dL (ref 0.50–1.10)
GFR, Est African American: 119 mL/min/{1.73_m2} (ref >=60)
GFR, Est Non African American: 102 mL/min/{1.73_m2} (ref >=60)
Globulin: 3.2 g/dL (calc) (ref 1.9–3.7)
Glucose, Bld: 88 mg/dL (ref 65–99)
Potassium: 5 mmol/L (ref 3.5–5.3)
Sodium: 141 mmol/L (ref 135–146)
Total Bilirubin: 0.5 mg/dL (ref 0.2–1.2)
Total Protein: 7.3 g/dL (ref 6.1–8.1)

## 2018-07-26 LAB — LIPID PANEL
CHOLESTEROL: 240 mg/dL — AB (ref <200)
HDL: 42 mg/dL — ABNORMAL LOW (ref >50)
LDL Cholesterol (Calc): 152 mg/dL (calc) — ABNORMAL HIGH
Non-HDL Cholesterol (Calc): 198 mg/dL (calc) — ABNORMAL HIGH (ref <130)
Total CHOL/HDL Ratio: 5.7 (calc) — ABNORMAL HIGH (ref <5.0)
Triglycerides: 282 mg/dL — ABNORMAL HIGH (ref <150)

## 2018-07-26 LAB — FOLLICLE STIMULATING HORMONE: FSH: 35.1 m[IU]/mL

## 2018-07-26 LAB — TSH: TSH: 1.22 mIU/L

## 2018-07-26 LAB — VITAMIN D 25 HYDROXY (VIT D DEFICIENCY, FRACTURES): Vit D, 25-Hydroxy: 35 ng/mL (ref 30–100)

## 2018-07-26 LAB — LUTEINIZING HORMONE: LH: 10 m[IU]/mL

## 2018-09-05 ENCOUNTER — Encounter: Payer: Self-pay | Admitting: Family Medicine

## 2018-09-05 ENCOUNTER — Ambulatory Visit (INDEPENDENT_AMBULATORY_CARE_PROVIDER_SITE_OTHER): Payer: BLUE CROSS/BLUE SHIELD | Admitting: Family Medicine

## 2018-09-05 VITALS — BP 120/76 | HR 92 | Temp 97.9°F | Resp 12 | Ht 68.0 in | Wt 156.7 lb

## 2018-09-05 DIAGNOSIS — Z0000 Encounter for general adult medical examination without abnormal findings: Secondary | ICD-10-CM

## 2018-09-05 MED ORDER — EPINEPHRINE 0.3 MG/0.3ML IJ SOAJ: 0.3000 mg | INTRAMUSCULAR | 1 refills | Status: AC | PRN

## 2018-09-05 NOTE — Assessment & Plan Note (Signed)
USPSTF grade A and B recommendations reviewed with patient; age-appropriate recommendations, preventive care, screening tests, etc discussed and encouraged; healthy living encouraged; see AVS for patient education given to patient  

## 2018-09-05 NOTE — Patient Instructions (Addendum)
Try to get 800 iu of vitamin D3 once a day during winter months (no more than 1000 iu daily) I do recommend yearly flu shots; for individuals who don't want flu shots, try to practice excellent hand hygiene, and avoid nursing homes, day cares, and hospitals during peak flu season; taking additional vitamin C daily during flu/cold season may help boost your immune system too  Health Maintenance, Female Adopting a healthy lifestyle and getting preventive care can go a long way to promote health and wellness. Talk with your health care provider about what schedule of regular examinations is right for you. This is a good chance for you to check in with your provider about disease prevention and staying healthy. In between checkups, there are plenty of things you can do on your own. Experts have done a lot of research about which lifestyle changes and preventive measures are most likely to keep you healthy. Ask your health care provider for more information. Weight and diet Eat a healthy diet  Be sure to include plenty of vegetables, fruits, low-fat dairy products, and lean protein.  Do not eat a lot of foods high in solid fats, added sugars, or salt.  Get regular exercise. This is one of the most important things you can do for your health. ? Most adults should exercise for at least 150 minutes each week. The exercise should increase your heart rate and make you sweat (moderate-intensity exercise). ? Most adults should also do strengthening exercises at least twice a week. This is in addition to the moderate-intensity exercise. Maintain a healthy weight  Body mass index (BMI) is a measurement that can be used to identify possible weight problems. It estimates body fat based on height and weight. Your health care provider can help determine your BMI and help you achieve or maintain a healthy weight.  For females 28 years of age and older: ? A BMI below 18.5 is considered underweight. ? A BMI of 18.5  to 24.9 is normal. ? A BMI of 25 to 29.9 is considered overweight. ? A BMI of 30 and above is considered obese. Watch levels of cholesterol and blood lipids  You should start having your blood tested for lipids and cholesterol at 49 years of age, then have this test every 5 years.  You may need to have your cholesterol levels checked more often if: ? Your lipid or cholesterol levels are high. ? You are older than 49 years of age. ? You are at high risk for heart disease. Cancer screening Lung Cancer  Lung cancer screening is recommended for adults 2-26 years old who are at high risk for lung cancer because of a history of smoking.  A yearly low-dose CT scan of the lungs is recommended for people who: ? Currently smoke. ? Have quit within the past 15 years. ? Have at least a 30-pack-year history of smoking. A pack year is smoking an average of one pack of cigarettes a day for 1 year.  Yearly screening should continue until it has been 15 years since you quit.  Yearly screening should stop if you develop a health problem that would prevent you from having lung cancer treatment. Breast Cancer  Practice breast self-awareness. This means understanding how your breasts normally appear and feel.  It also means doing regular breast self-exams. Let your health care provider know about any changes, no matter how small.  If you are in your 20s or 30s, you should have a clinical breast exam (  CBE) by a health care provider every 1-3 years as part of a regular health exam.  If you are 61 or older, have a CBE every year. Also consider having a breast X-ray (mammogram) every year.  If you have a family history of breast cancer, talk to your health care provider about genetic screening.  If you are at high risk for breast cancer, talk to your health care provider about having an MRI and a mammogram every year.  Breast cancer gene (BRCA) assessment is recommended for women who have family  members with BRCA-related cancers. BRCA-related cancers include: ? Breast. ? Ovarian. ? Tubal. ? Peritoneal cancers.  Results of the assessment will determine the need for genetic counseling and BRCA1 and BRCA2 testing. Cervical Cancer Your health care provider may recommend that you be screened regularly for cancer of the pelvic organs (ovaries, uterus, and vagina). This screening involves a pelvic examination, including checking for microscopic changes to the surface of your cervix (Pap test). You may be encouraged to have this screening done every 3 years, beginning at age 52.  For women ages 25-65, health care providers may recommend pelvic exams and Pap testing every 3 years, or they may recommend the Pap and pelvic exam, combined with testing for human papilloma virus (HPV), every 5 years. Some types of HPV increase your risk of cervical cancer. Testing for HPV may also be done on women of any age with unclear Pap test results.  Other health care providers may not recommend any screening for nonpregnant women who are considered low risk for pelvic cancer and who do not have symptoms. Ask your health care provider if a screening pelvic exam is right for you.  If you have had past treatment for cervical cancer or a condition that could lead to cancer, you need Pap tests and screening for cancer for at least 20 years after your treatment. If Pap tests have been discontinued, your risk factors (such as having a new sexual partner) need to be reassessed to determine if screening should resume. Some women have medical problems that increase the chance of getting cervical cancer. In these cases, your health care provider may recommend more frequent screening and Pap tests. Colorectal Cancer  This type of cancer can be detected and often prevented.  Routine colorectal cancer screening usually begins at 48 years of age and continues through 49 years of age.  Your health care provider may recommend  screening at an earlier age if you have risk factors for colon cancer.  Your health care provider may also recommend using home test kits to check for hidden blood in the stool.  A small camera at the end of a tube can be used to examine your colon directly (sigmoidoscopy or colonoscopy). This is done to check for the earliest forms of colorectal cancer.  Routine screening usually begins at age 58.  Direct examination of the colon should be repeated every 5-10 years through 49 years of age. However, you may need to be screened more often if early forms of precancerous polyps or small growths are found. Skin Cancer  Check your skin from head to toe regularly.  Tell your health care provider about any new moles or changes in moles, especially if there is a change in a mole's shape or color.  Also tell your health care provider if you have a mole that is larger than the size of a pencil eraser.  Always use sunscreen. Apply sunscreen liberally and repeatedly throughout  the day.  Protect yourself by wearing long sleeves, pants, a wide-brimmed hat, and sunglasses whenever you are outside. Heart disease, diabetes, and high blood pressure  High blood pressure causes heart disease and increases the risk of stroke. High blood pressure is more likely to develop in: ? People who have blood pressure in the high end of the normal range (130-139/85-89 mm Hg). ? People who are overweight or obese. ? People who are African American.  If you are 59-77 years of age, have your blood pressure checked every 3-5 years. If you are 50 years of age or older, have your blood pressure checked every year. You should have your blood pressure measured twice-once when you are at a hospital or clinic, and once when you are not at a hospital or clinic. Record the average of the two measurements. To check your blood pressure when you are not at a hospital or clinic, you can use: ? An automated blood pressure machine at a  pharmacy. ? A home blood pressure monitor.  If you are between 58 years and 70 years old, ask your health care provider if you should take aspirin to prevent strokes.  Have regular diabetes screenings. This involves taking a blood sample to check your fasting blood sugar level. ? If you are at a normal weight and have a low risk for diabetes, have this test once every three years after 49 years of age. ? If you are overweight and have a high risk for diabetes, consider being tested at a younger age or more often. Preventing infection Hepatitis B  If you have a higher risk for hepatitis B, you should be screened for this virus. You are considered at high risk for hepatitis B if: ? You were born in a country where hepatitis B is common. Ask your health care provider which countries are considered high risk. ? Your parents were born in a high-risk country, and you have not been immunized against hepatitis B (hepatitis B vaccine). ? You have HIV or AIDS. ? You use needles to inject street drugs. ? You live with someone who has hepatitis B. ? You have had sex with someone who has hepatitis B. ? You get hemodialysis treatment. ? You take certain medicines for conditions, including cancer, organ transplantation, and autoimmune conditions. Hepatitis C  Blood testing is recommended for: ? Everyone born from [birth year] through 1966. ? Anyone with known risk factors for hepatitis C. Sexually transmitted infections (STIs)  You should be screened for sexually transmitted infections (STIs) including gonorrhea and chlamydia if: ? You are sexually active and are younger than 49 years of age. ? You are older than 49 years of age and your health care provider tells you that you are at risk for this type of infection. ? Your sexual activity has changed since you were last screened and you are at an increased risk for chlamydia or gonorrhea. Ask your health care provider if you are at risk.  If you do not have  HIV, but are at risk, it may be recommended that you take a prescription medicine daily to prevent HIV infection. This is called pre-exposure prophylaxis (PrEP). You are considered at risk if: ? You are sexually active and do not regularly use condoms or know the HIV status of your partner(s). ? You take drugs by injection. ? You are sexually active with a partner who has HIV. Talk with your health care provider about whether you are at high risk of being  infected with HIV. If you choose to begin PrEP, you should first be tested for HIV. You should then be tested every 3 months for as long as you are taking PrEP. Pregnancy  If you are premenopausal and you may become pregnant, ask your health care provider about preconception counseling.  If you may become pregnant, take 400 to 800 micrograms (mcg) of folic acid every day.  If you want to prevent pregnancy, talk to your health care provider about birth control (contraception). Osteoporosis and menopause  Osteoporosis is a disease in which the bones lose minerals and strength with aging. This can result in serious bone fractures. Your risk for osteoporosis can be identified using a bone density scan.  If you are 26 years of age or older, or if you are at risk for osteoporosis and fractures, ask your health care provider if you should be screened.  Ask your health care provider whether you should take a calcium or vitamin D supplement to lower your risk for osteoporosis.  Menopause may have certain physical symptoms and risks.  Hormone replacement therapy may reduce some of these symptoms and risks. Talk to your health care provider about whether hormone replacement therapy is right for you. Follow these instructions at home:  Schedule regular health, dental, and eye exams.  Stay current with your immunizations.  Do not use any tobacco products including cigarettes, chewing tobacco, or electronic cigarettes.  If you are pregnant, do not  drink alcohol.  If you are breastfeeding, limit how much and how often you drink alcohol.  Limit alcohol intake to no more than 1 drink per day for nonpregnant women. One drink equals 12 ounces of beer, 5 ounces of wine, or 1 ounces of hard liquor.  Do not use street drugs.  Do not share needles.  Ask your health care provider for help if you need support or information about quitting drugs.  Tell your health care provider if you often feel depressed.  Tell your health care provider if you have ever been abused or do not feel safe at home. This information is not intended to replace advice given to you by your health care provider. Make sure you discuss any questions you have with your health care provider. Document Released: 02/05/2011 Document Revised: 12/29/2015 Document Reviewed: 04/26/2015 Elsevier Interactive Patient Education  2019 [employer].

## 2018-09-05 NOTE — Progress Notes (Signed)
BP 120/76   Pulse 92   Temp 97.9 F (36.6 C) (Oral)   Resp 12   Ht _0  (1.727 m)   Wt 156 lb 11.2 oz (71.1 kg)   SpO2 99%   BMI 23.83 kg/m    Subjective:    Patient ID: Cassidy Lewis, female    DOB: 12/19/1970, 48 y.o.   MRN: [id number]  HPI: Cassidy Lewis is a 48 y.o. female  Chief Complaint  Patient presents with  . Annual Exam    HPI USPSTF grade A and B recommendations Depression:  Depression screen [facility] 2/9 09/05/2018 07/25/2018 06/18/2017 05/28/2017  Decreased Interest 0 0 0 0  Down, Depressed, Hopeless 0 0 0 0  PHQ - 2 Score 0 0 0 0  Altered sleeping 0 0 - -  Tired, decreased energy 0 0 - -  Change in appetite 0 0 - -  Feeling bad or failure about yourself  0 0 - -  Trouble concentrating 0 0 - -  Moving slowly or fidgety/restless 0 0 - -  Suicidal thoughts 0 0 - -  PHQ-9 Score 0 0 - -  Difficult doing work/chores Not difficult at all Not difficult at all - -   Hypertension: BP Readings from Last 3 Encounters:  09/05/18 120/76  07/25/18 128/76  05/14/18 130/90   Obesity: Wt Readings from Last 3 Encounters:  09/05/18 156 lb 11.2 oz (71.1 kg)  07/25/18 157 lb (71.2 kg)  05/14/18 157 lb 1.6 oz (71.3 kg)   BMI Readings from Last 3 Encounters:  09/05/18 23.83 kg/m  07/25/18 23.87 kg/m  05/14/18 23.89 kg/m     Skin cancer: nothing worrisome Lung cancer:  nonsmoker Breast cancer: no lumps or bumps Last mammo 2016; ordered today Colorectal cancer: no fam hx; start at age 85 Cervical cancer screening: s/p hyst noncancerous reasons BRCA gene screening: family hx of breast and/or ovarian cancer and/or metastatic prostate cancer? Ovarian and stomach HIV, hep B, hep C: not interested STD testing and prevention (chl/gon/syphilis): not interested Intimate partner violence: no abuse Contraception: n/a Osteoporosis: n/a Fall prevention/vitamin D: discussed; regular sun exposure during the summer Immunizations: tetanus due, but politely decline  today; declines flu shot Diet: getting fruits and veggies; changed up diet recently; tries to limit red meat Exercise: was for a while; thinking about going back (start slow and build up gradually) Alcohol:    Office Visit from 09/05/2018 in [facility]  AUDIT-C Score  0      Tobacco use: nonsmoker AAA: n/a Aspirin: n/a Glucose:  Glucose  Date Value Ref Range Status  03/03/2015 91 65 - 99 mg/dL Final   Glucose, Bld  Date Value Ref Range Status  07/25/2018 88 65 - 99 mg/dL Final    Comment:    .            Fasting reference interval .   05/28/2017 86 65 - 99 mg/dL Final    Comment:    .            Fasting reference interval .    Lipids: trying to limit red meats; no eggs; not much cheese Lab Results  Component Value Date   CHOL 240 (H) 07/25/2018   Lab Results  Component Value Date   HDL 42 (L) 07/25/2018   Lab Results  Component Value Date   LDLCALC 152 (H) 07/25/2018   Lab Results  Component Value Date   TRIG 282 (H) 07/25/2018   Lab Results  Component Value Date   CHOLHDL 5.7 (H) 07/25/2018   No results found for: LDLDIRECT   Depression screen [facility] 2/9 09/05/2018 07/25/2018 06/18/2017 05/28/2017  Decreased Interest 0 0 0 0  Down, Depressed, Hopeless 0 0 0 0  PHQ - 2 Score 0 0 0 0  Altered sleeping 0 0 - -  Tired, decreased energy 0 0 - -  Change in appetite 0 0 - -  Feeling bad or failure about yourself  0 0 - -  Trouble concentrating 0 0 - -  Moving slowly or fidgety/restless 0 0 - -  Suicidal thoughts 0 0 - -  PHQ-9 Score 0 0 - -  Difficult doing work/chores Not difficult at all Not difficult at all - -   Fall Risk  09/05/2018 07/25/2018 05/14/2018 06/18/2017 05/28/2017  Falls in the past year? 0 0 No No No  Number falls in past yr: 0 0 - - -  Injury with Fall? 0 0 - - -    Relevant past medical, surgical, family and social history reviewed Past Medical History:  Diagnosis Date  . Endometriosis   . Ovarian cyst   .  Vitamin B12 deficiency   . Vitamin D deficiency disease    Past Surgical History:  Procedure Laterality Date  . ABDOMINAL HYSTERECTOMY     partial  . CESAREAN SECTION     x 3  . DILATION AND CURETTAGE OF UTERUS     Family History  Problem Relation Age of Onset  . Cancer Mother        ovarian  . Stroke Mother        possibly a small stroke  . Depression Mother        Bipolar  . Diabetes Father   . Hyperlipidemia Father   . Hypertension Father   . Cancer Father        stomach cancer  . Cancer Paternal Grandfather        lung cancer, died at age 49  . Diabetes Maternal Grandmother   . Gout Maternal Grandmother   . Stroke Sister        fraction strokes  . Endometriosis Sister   . Migraines Sister   . Heart failure Paternal Grandmother   . COPD Neg Hx   . Heart disease Neg Hx    Social History   Tobacco Use  . Smoking status: Never Smoker  . Smokeless tobacco: Never Used  Substance Use Topics  . Alcohol use: No    Frequency: Never    Comment: very rare  . Drug use: No     Office Visit from 09/05/2018 in [facility]  AUDIT-C Score  0      Interim medical history since last visit reviewed. Allergies and medications reviewed  Review of Systems  Constitutional: Negative for fever and unexpected weight change.  HENT: Positive for postnasal drip and rhinorrhea. Negative for hearing loss and sore throat (nothing now).   Eyes: Negative for visual disturbance.       Wears glasses  Respiratory: Negative for wheezing.   Cardiovascular: Negative for chest pain.  Gastrointestinal: Negative for blood in stool.  Endocrine: Negative for polydipsia.  Genitourinary: Negative for hematuria.  Musculoskeletal:       Wnter time will have some joint pain, played sports in school  Skin:       No worrisome moles  Allergic/Immunologic: Positive for food allergies (coconut; would like epipen).  Neurological: Negative for tremors.  Hematological: Negative for  adenopathy. Does not bruise/bleed easily.  Psychiatric/Behavioral: Negative for dysphoric mood.   Per HPI unless specifically indicated above     Objective:    BP 120/76   Pulse 92   Temp 97.9 F (36.6 C) (Oral)   Resp 12   Ht _0  (1.727 m)   Wt 156 lb 11.2 oz (71.1 kg)   SpO2 99%   BMI 23.83 kg/m   Wt Readings from Last 3 Encounters:  09/05/18 156 lb 11.2 oz (71.1 kg)  07/25/18 157 lb (71.2 kg)  05/14/18 157 lb 1.6 oz (71.3 kg)    Physical Exam Vitals signs reviewed. Exam conducted with a chaperone present.  Constitutional:      Appearance: Normal appearance. She is well-developed.  HENT:     Head: Normocephalic and atraumatic.     Right Ear: Hearing, tympanic membrane, ear canal and external ear normal.     Left Ear: Hearing, tympanic membrane, ear canal and external ear normal.  Eyes:     General: No scleral icterus.       Right eye: No hordeolum.        Left eye: No hordeolum.     Conjunctiva/sclera: Conjunctivae normal.  Neck:     Thyroid: No thyromegaly.     Vascular: No carotid bruit.  Cardiovascular:     Rate and Rhythm: Normal rate and regular rhythm.  No extrasystoles are present.    Heart sounds: Normal heart sounds, S1 normal and S2 normal.  Pulmonary:     Effort: Pulmonary effort is normal. No respiratory distress.     Breath sounds: Normal breath sounds.  Chest:     Breasts: Breasts are symmetrical.        Right: No inverted nipple, mass, nipple discharge, skin change or tenderness.        Left: No inverted nipple, mass, nipple discharge, skin change or tenderness.     Comments: Chaperone present during breast exam Abdominal:     General: Bowel sounds are normal. There is no distension or abdominal bruit.     Palpations: Abdomen is soft. There is no mass or pulsatile mass.     Tenderness: There is no abdominal tenderness.     Hernia: No hernia is present.  Musculoskeletal: Normal range of motion.  Lymphadenopathy:     Head:     Right side of  head: No submandibular adenopathy.     Left side of head: No submandibular adenopathy.     Cervical: No cervical adenopathy.  Skin:    General: Skin is warm and dry.     Coloration: Skin is not pale.     Findings: No bruising or ecchymosis.  Neurological:     Mental Status: She is alert.     Cranial Nerves: No cranial nerve deficit.     Motor: No tremor or abnormal muscle tone.     Gait: Gait normal.     Deep Tendon Reflexes:     Reflex Scores:      Patellar reflexes are 2+ on the right side and 2+ on the left side. Psychiatric:        Mood and Affect: Mood is not anxious or depressed.        Speech: Speech normal.        Behavior: Behavior normal.        Thought Content: Thought content normal.     Results for orders placed or performed in visit on 07/25/18  [facility]  Result Value Ref Range  LH 10.0 mIU/mL  [facility]  Result Value Ref Range   FSH 35.1 mIU/mL  TSH  Result Value Ref Range   TSH 1.22 mIU/L  VITAMIN D 25 Hydroxy (Vit-D Deficiency, Fractures)  Result Value Ref Range   Vit D, 25-Hydroxy 35 30 - 100 ng/mL  CBC with Differential/Platelet  Result Value Ref Range   WBC 7.0 3.8 - 10.8 Thousand/uL   RBC 4.55 3.80 - 5.10 Million/uL   Hemoglobin 13.6 11.7 - 15.5 g/dL   HCT 39.3 35.0 - 45.0 %   MCV 86.4 80.0 - 100.0 fL   MCH 29.9 27.0 - 33.0 pg   MCHC 34.6 32.0 - 36.0 g/dL   RDW 11.8 11.0 - 15.0 %   Platelets 273 140 - 400 Thousand/uL   MPV 12.2 7.5 - 12.5 fL   Neutro Abs 4,256 1,500 - 7,800 cells/uL   Lymphs Abs 2,100 850 - 3,900 cells/uL   Absolute Monocytes 455 200 - 950 cells/uL   Eosinophils Absolute 161 15 - 500 cells/uL   Basophils Absolute 28 0 - 200 cells/uL   Neutrophils Relative % 60.8 %   Total Lymphocyte 30.0 %   Monocytes Relative 6.5 %   Eosinophils Relative 2.3 %   Basophils Relative 0.4 %  COMPLETE METABOLIC PANEL WITH GFR  Result Value Ref Range   Glucose, Bld 88 65 - 99 mg/dL   BUN 11 7 - 25 mg/dL   Creat 0.70 0.50 - 1.10 mg/dL   GFR, Est Non  African American 102 > OR = 60 mL/min/1.39m   GFR, Est African American 119 > OR = 60 mL/min/1.740m  BUN/Creatinine Ratio NOT APPLICABLE 6 - 22 (calc)   Sodium 141 135 - 146 mmol/L   Potassium 5.0 3.5 - 5.3 mmol/L   Chloride 105 98 - 110 mmol/L   CO2 28 20 - 32 mmol/L   Calcium 9.8 8.6 - 10.2 mg/dL   Total Protein 7.3 6.1 - 8.1 g/dL   Albumin 4.1 3.6 - 5.1 g/dL   Globulin 3.2 1.9 - 3.7 g/dL (calc)   AG Ratio 1.3 1.0 - 2.5 (calc)   Total Bilirubin 0.5 0.2 - 1.2 mg/dL   Alkaline phosphatase (APISO) 90 33 - 115 U/L   AST 23 10 - 35 U/L   ALT 22 6 - 29 U/L  Lipid panel  Result Value Ref Range   Cholesterol 240 (H) <200 mg/dL   HDL 42 (L) >50 mg/dL   Triglycerides 282 (H) <150 mg/dL   LDL Cholesterol (Calc) 152 (H) mg/dL (calc)   Total CHOL/HDL Ratio 5.7 (H) <5.0 (calc)   Non-HDL Cholesterol (Calc) 198 (H) <130 mg/dL (calc)      Assessment & Plan:   Problem List Items Addressed This Visit      Other   Preventative health care    USPSTF grade A and B recommendations reviewed with patient; age-appropriate recommendations, preventive care, screening tests, etc discussed and encouraged; healthy living encouraged; see AVS for patient education given to patient        Other Visit Diagnoses    Routine general medical examination at a health care facility    -  Primary      Follow up plan: Return in 1 year (on 09/06/2019) for complete physical.  An after-visit summary was printed and given to the patient at [facility] Please see the patient instructions which may contain other information and recommendations beyond what is mentioned above in the assessment and plan.  Meds ordered this encounter  Medications  . EPINEPHrine 0.3 mg/0.3 mL IJ SOAJ injection    Sig: Inject 0.3 mLs (0.3 mg total) into the muscle as needed for anaphylaxis (for life-threatening allergic reaction).    Dispense:  1 Device    Refill:  1    No orders of the defined types were placed in this  encounter.

## 2018-09-11 IMAGING — US US TRANSVAGINAL NON-OB
1 series · 13 of 25 positions shown · non-contrast
Comparison: CT of the abdomen and pelvis on 06/04/2017

CLINICAL DATA: Complex cyst of the left ovary. Hysterectomy 15
years ago.



[Series 1: us transvaginal non-ob · 0.24mm/px · 109 acquisitions, 13 frames shown]
[im 1/109]
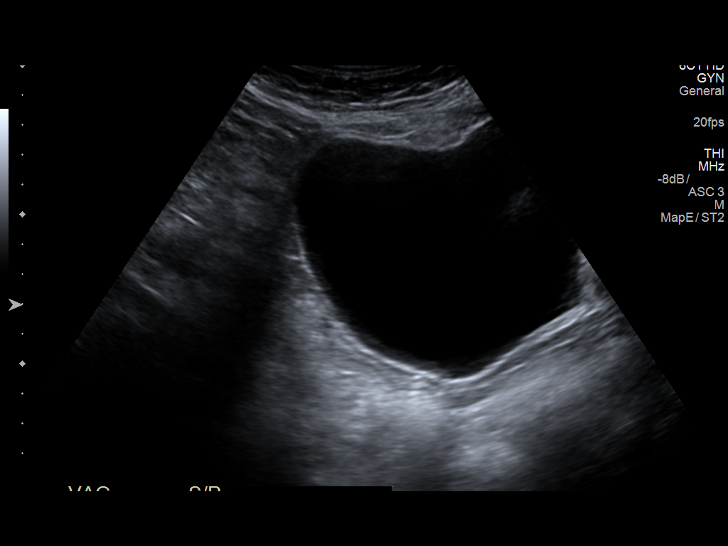
[im 10/109]
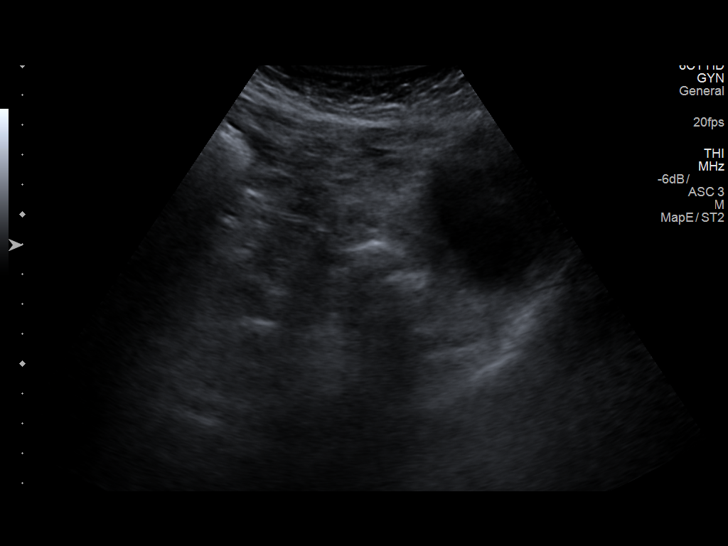
[im 19/109]
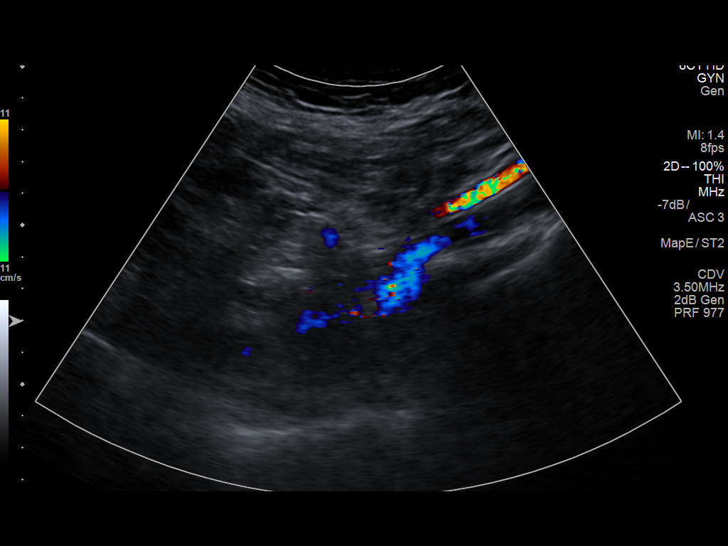
[im 28/109]
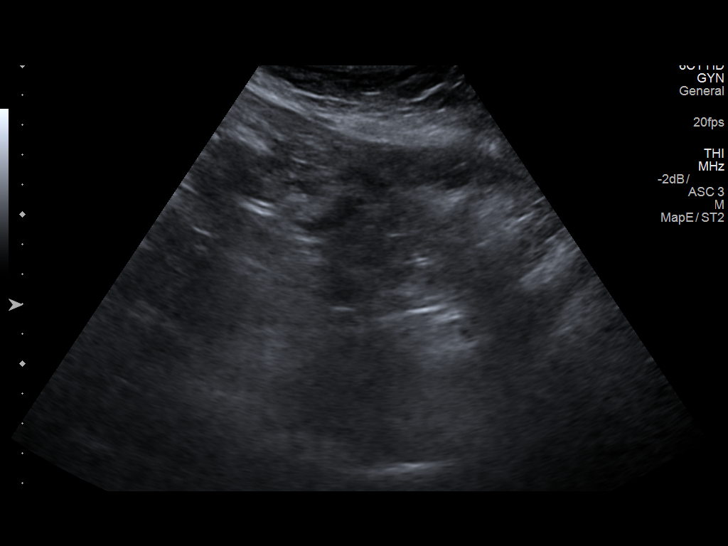
[im 37/109]
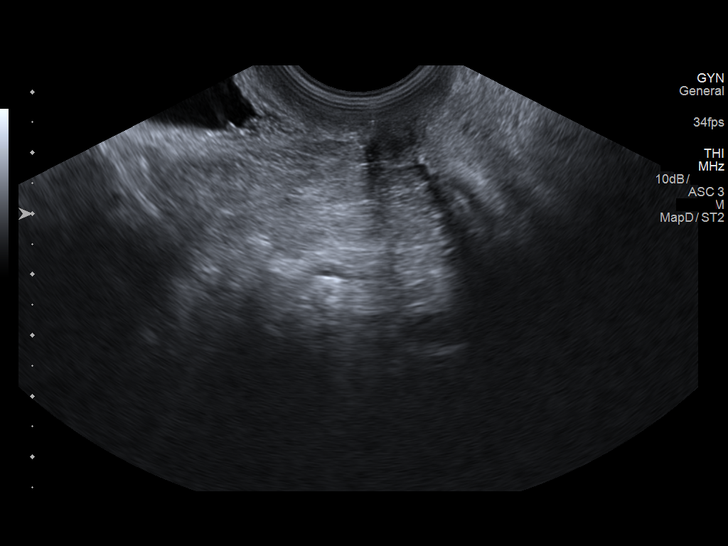
[im 46/109]
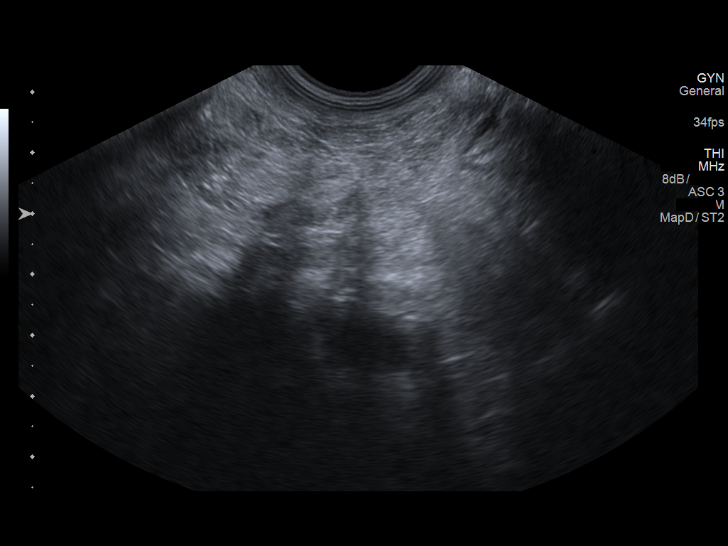
[im 55/109]
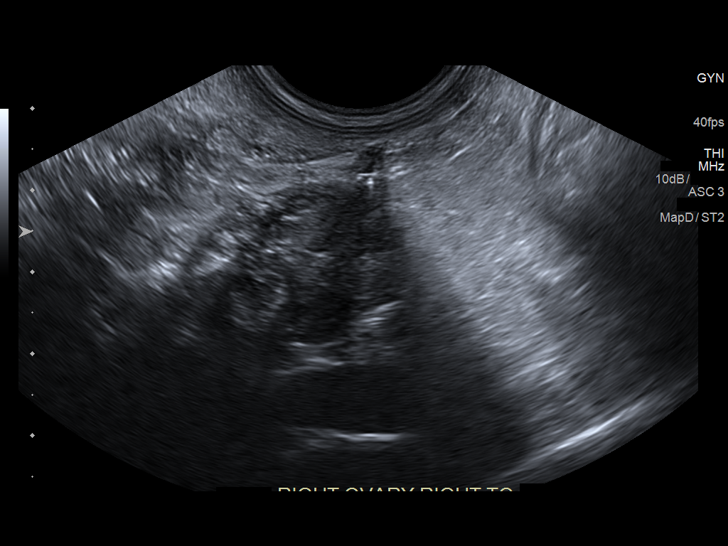
[im 64/109]
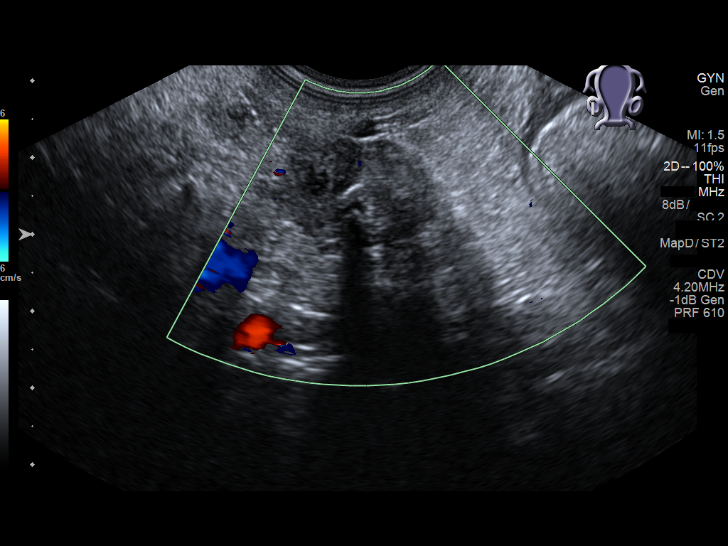
[im 73/109]
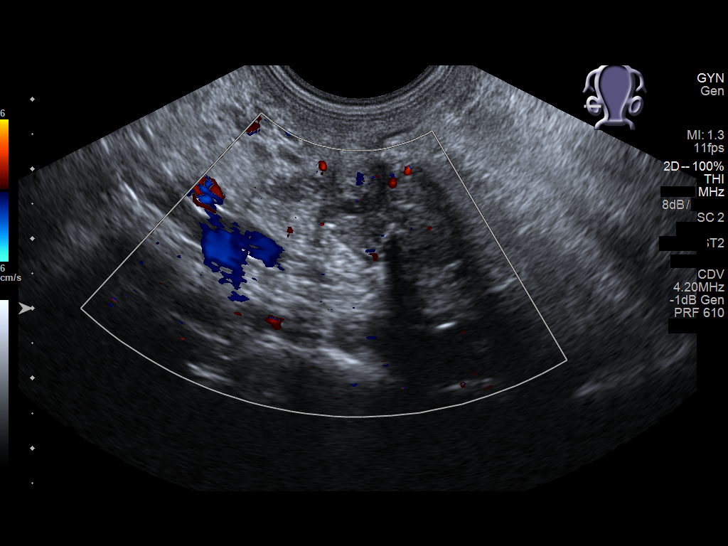
[im 82/109]
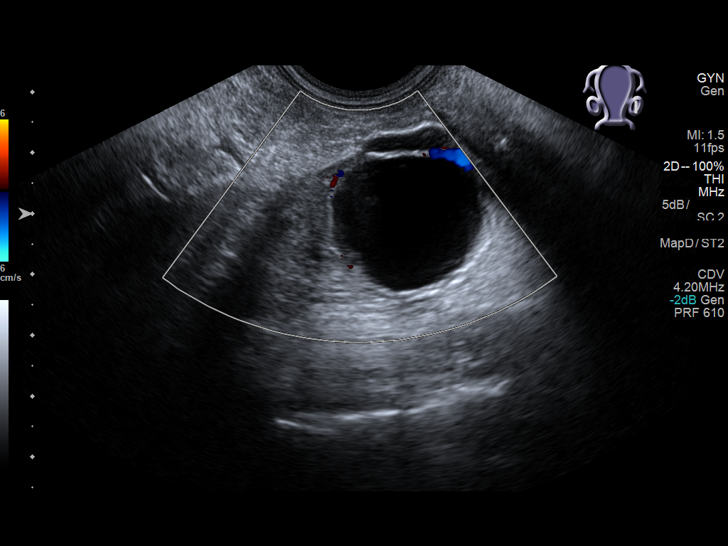
[im 91/109]
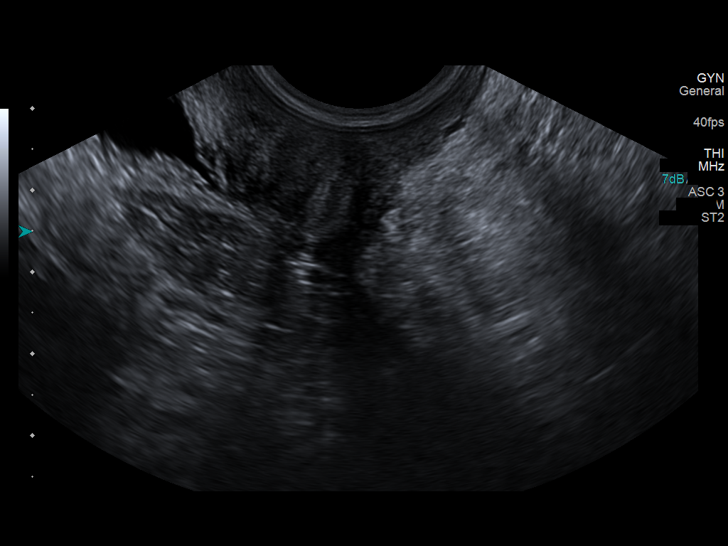
[im 100/109]
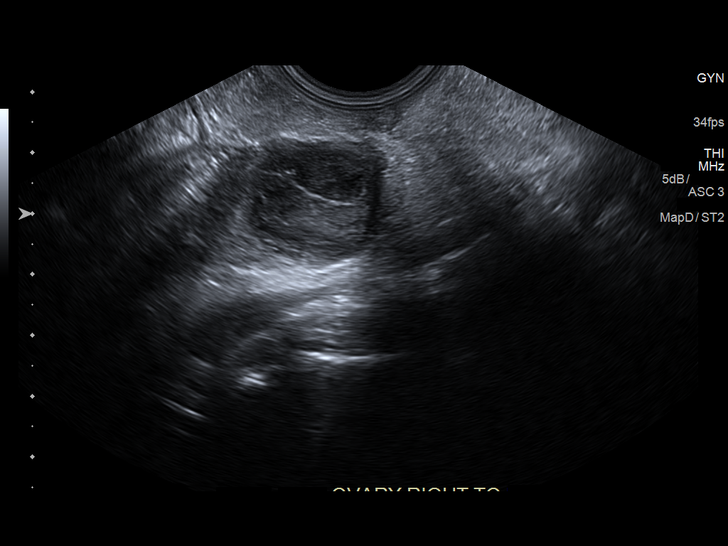
[im 109/109]
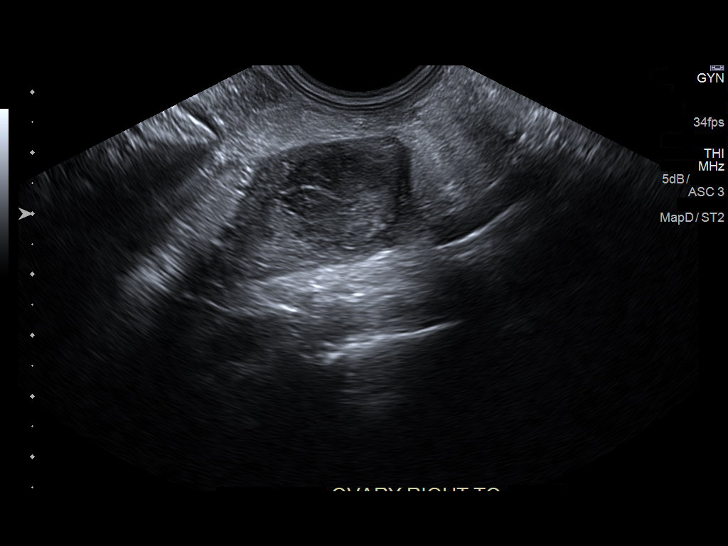

[13 of 25 positions shown; findings below may reference images not displayed]

FINDINGS: Uterus

Measurements: Surgically absent. Vaginal cuff is unremarkable in
appearance.

Endometrium

Thickness: Surgically absent uterus.

Right ovary

Measurements: 2.3 x 1.6 x 2.0 cm. Multiple hyperechoic foci are
identified throughout the right ovary. No suspicious mass.

Left ovary

Measurements: 4.9 x 2.1 x 3.1 cm. An anechoic cyst is 2.5 x 2.6 x
2.5 cm. A circumscribed mildly heterogeneous hypoechoic mass is
x 2.1 x 2.1 cm. Blood flow is identified within the ovarian
parenchyma.

Other findings

No abnormal free fluid.
IMPRESSION: 1. Prior hysterectomy.  Unremarkable vaginal cuff.
2. 2.5 cm benign-appearing anechoic left ovarian cyst adjacent to a
2.1 cm benign-appearing hypoechoic left ovarian cyst. Given the
patient's pain, followup is indicated. There is no evidence for
torsion.
3. Follow-up pelvic ultrasound is recommended in 8-12 weeks.

## 2019-01-07 DIAGNOSIS — Z1159 Encounter for screening for other viral diseases: Secondary | ICD-10-CM | POA: Diagnosis not present

## 2019-01-07 DIAGNOSIS — Z20828 Contact with and (suspected) exposure to other viral communicable diseases: Secondary | ICD-10-CM | POA: Diagnosis not present

## 2019-09-11 ENCOUNTER — Encounter: Payer: BLUE CROSS/BLUE SHIELD | Admitting: Family Medicine

## 2019-09-14 ENCOUNTER — Encounter: Payer: BLUE CROSS/BLUE SHIELD | Admitting: Family Medicine

## 2019-10-02 ENCOUNTER — Ambulatory Visit (INDEPENDENT_AMBULATORY_CARE_PROVIDER_SITE_OTHER): Payer: BC Managed Care – PPO | Admitting: Family Medicine

## 2019-10-02 ENCOUNTER — Other Ambulatory Visit: Payer: Self-pay

## 2019-10-02 ENCOUNTER — Encounter: Payer: Self-pay | Admitting: Family Medicine

## 2019-10-02 VITALS — BP 132/86 | HR 102 | Temp 97.8°F | Resp 14 | Ht 68.0 in | Wt 162.4 lb

## 2019-10-02 DIAGNOSIS — Z130 Encounter for screening for diseases of the blood and blood-forming organs and certain disorders involving the immune mechanism: Secondary | ICD-10-CM

## 2019-10-02 DIAGNOSIS — Z13220 Encounter for screening for lipoid disorders: Secondary | ICD-10-CM

## 2019-10-02 DIAGNOSIS — Z124 Encounter for screening for malignant neoplasm of cervix: Secondary | ICD-10-CM

## 2019-10-02 DIAGNOSIS — Z23 Encounter for immunization: Secondary | ICD-10-CM | POA: Diagnosis not present

## 2019-10-02 DIAGNOSIS — Z0000 Encounter for general adult medical examination without abnormal findings: Secondary | ICD-10-CM

## 2019-10-02 DIAGNOSIS — Z1329 Encounter for screening for other suspected endocrine disorder: Secondary | ICD-10-CM | POA: Diagnosis not present

## 2019-10-02 DIAGNOSIS — Z1231 Encounter for screening mammogram for malignant neoplasm of breast: Secondary | ICD-10-CM

## 2019-10-02 DIAGNOSIS — Z13228 Encounter for screening for other metabolic disorders: Secondary | ICD-10-CM

## 2019-10-02 NOTE — Progress Notes (Signed)
Patient: Cassidy Lewis, Female    DOB: 04-21-1971, 49 y.o.   MRN: [id number] Delsa Grana, PA-C Visit Date: 10/02/2019  Today's Provider: Delsa Grana, PA-C   Chief Complaint  Patient presents with  . Annual Exam    see's gyn   Subjective:   Annual physical exam:  Cassidy Lewis is a 49 y.o. female who presents today for complete physical exam:  Works at [employer] (3:30 am starts work) She has increased weight from her normal 162, she's frustrated with it even though she is at healthy BMI but much higher weight  Exercise/Activity:  A lot of physical work with her job, and with good weather she walks 1-1.5 miles   Diet/nutrition:  Decreasing amount of soda, increasing water, coffee in morning  Sleep:  Poor sleep 8 pm to 10 pm, a lot of waking up with hot flashes, not   Hx of high cholesterol  She does a lot of her GYN health through O[facility] - [facility] through work and some of her vaccines and immunizations Endometriosis and hx of cysts, s/p partial hysterectomy, mother with ovarian hx - she thinks she's premenopausal  USPSTF grade A and B recommendations - reviewed and addressed today  Depression:  Phq 9 completed today by patient, was reviewed by me with patient in the room PHQ score is good, pt feels overall good, but frustrated a little with her weight and body PHQ 2/9 Scores 10/02/2019 09/05/2018 07/25/2018 06/18/2017  PHQ - 2 Score 0 0 0 0  PHQ- 9 Score 2 0 0 -   Depression screen [facility] 2/9 10/02/2019 09/05/2018 07/25/2018 06/18/2017 05/28/2017  Decreased Interest 0 0 0 0 0  Down, Depressed, Hopeless 0 0 0 0 0  PHQ - 2 Score 0 0 0 0 0  Altered sleeping 1 0 0 - -  Tired, decreased energy 1 0 0 - -  Change in appetite 0 0 0 - -  Feeling bad or failure about yourself  0 0 0 - -  Trouble concentrating 0 0 0 - -  Moving slowly or fidgety/restless 0 0 0 - -  Suicidal thoughts 0 0 0 - -  PHQ-9 Score 2 0 0 - -  Difficult doing work/chores Not difficult at all Not  difficult at all Not difficult at all - -    Alcohol screening:   Office Visit from 10/02/2019 in [facility]  AUDIT-C Score  0      Immunizations and Health Maintenance: Health Maintenance  Topic Date Due  . PAP SMEAR-Modifier  01/28/1991  . MAMMOGRAM  03/22/2016  . TETANUS/TDAP  08/06/2017  . INFLUENZA VACCINE  11/04/2019 (Originally 03/07/2019)  . HIV Screening  Completed   STD testing and prevention (HIV/chl/gon/syphilis):  see above, no additional testing desired by pt today  Intimate partner violence:  safe  Sexual History/Pain during Intercourse: Married    Menstrual History/LMP/Abnormal Bleeding:  31 surgery - no abnormal bleeding No LMP recorded. Patient has had a hysterectomy.  Incontinence Symptoms: none  Breast cancer:  Last Mammogram: *see HM list above - scheduled for next month BRCA gene screening:  none  Cervical cancer screening:  See HM for PAP - discontinued Pt has family hx of cancers - ovarian CA in mom, cousins and multiple family members with endometriosis and other GYN problems  - no known family hx breast, uterine, colon  Osteoporosis:   Discussion on osteoporosis per age, including high calcium and vitamin D supplementation, weight bearing exercises -  handout given for proper doses for her age Pt is supplementing with Vit D Roughly experienced menopause at age aroudn now, but hysterectomy at age 18  Skin cancer:  Hx of skin CA -  NO Discussed atypical lesions   Colorectal cancer:   Colonoscopy is due next year Discussed concerning signs and sx of CRC, pt denies melena, hematochezia, change in BM or stool caliber  Lung cancer:   Low Dose CT Chest recommended if Age 62-80 years, 30 pack-year currently smoking OR have quit w/in 15years. Patient does not qualify.    Social History   Tobacco Use  . Smoking status: Never Smoker  . Smokeless tobacco: Never Used  Substance Use Topics  . Alcohol use: No    Comment: very rare    . Drug use: No       [facility] Visit [facility] 10/02/2019 in [facility]  AUDIT-C Score  0      Family History  Problem Relation Age of Onset  . Cancer Mother        ovarian  . Stroke Mother        possibly a small stroke  . Depression Mother        Bipolar  . Diabetes Father   . Hyperlipidemia Father   . Hypertension Father   . Cancer Father        stomach cancer  . Cancer Paternal Grandfather        lung cancer, died at age 66  . Diabetes Maternal Grandmother   . Gout Maternal Grandmother   . Stroke Sister        fraction strokes  . Endometriosis Sister   . Migraines Sister   . Heart failure Paternal Grandmother   . COPD Neg Hx   . Heart disease Neg Hx     Blood pressure/Hypertension: BP Readings from Last 3 Encounters:  10/02/19 132/86  09/05/18 120/76  07/25/18 128/76   Weight/Obesity: Wt Readings from Last 3 Encounters:  10/02/19 162 lb 6.4 oz (73.7 kg)  09/05/18 156 lb 11.2 oz (71.1 kg)  07/25/18 157 lb (71.2 kg)   BMI Readings from Last 3 Encounters:  10/02/19 24.69 kg/m  09/05/18 23.83 kg/m  07/25/18 23.87 kg/m    Lipids:  Lab Results  Component Value Date   CHOL 240 (H) 07/25/2018   Lab Results  Component Value Date   HDL 42 (L) 07/25/2018   Lab Results  Component Value Date   LDLCALC 152 (H) 07/25/2018   Lab Results  Component Value Date   TRIG 282 (H) 07/25/2018   Lab Results  Component Value Date   CHOLHDL 5.7 (H) 07/25/2018   No results found for: LDLDIRECT Based on the results of lipid panel his/her cardiovascular risk factor ( using [place] )  in the next 10 years is: The 10-year ASCVD risk score Mikey Bussing DC Brooke Bonito., et al., 2013) is: 2.2%   Values used to calculate the score:     Age: 32 years     Sex: Female     Is Non-Hispanic African American: No     Diabetic: No     Tobacco smoker: No     Systolic Blood Pressure: 026 mmHg     Is BP treated: No     HDL Cholesterol: 42 mg/dL     Total Cholesterol: 240  mg/dL Glucose:  Glucose  Date Value Ref Range Status  03/03/2015 91 65 - 99 mg/dL Final   Glucose, Bld  Date Value Ref  Range Status  07/25/2018 88 65 - 99 mg/dL Final    Comment:    .            Fasting reference interval .   05/28/2017 86 65 - 99 mg/dL Final    Comment:    .            Fasting reference interval .    Hypertension: BP Readings from Last 3 Encounters:  10/02/19 132/86  09/05/18 120/76  07/25/18 128/76   Obesity: Wt Readings from Last 3 Encounters:  10/02/19 162 lb 6.4 oz (73.7 kg)  09/05/18 156 lb 11.2 oz (71.1 kg)  07/25/18 157 lb (71.2 kg)   BMI Readings from Last 3 Encounters:  10/02/19 24.69 kg/m  09/05/18 23.83 kg/m  07/25/18 23.87 kg/m      Advanced Care Planning:  A voluntary discussion about advance care planning including the explanation and discussion of advance directives.   Discussed health care proxy and Living will, and the patient was able to identify a health care proxy as - husband Kanitra Purifoy Patient does not have a living will at present time.   Social History      She        Social History   Socioeconomic History  . Marital status: Married    Spouse name: Edd Arbour  . Number of children: 3  . Years of education: Not on file  . Highest education level: GED or equivalent  Occupational History  . Not on file  Tobacco Use  . Smoking status: Never Smoker  . Smokeless tobacco: Never Used  Substance and Sexual Activity  . Alcohol use: No    Comment: very rare  . Drug use: No  . Sexual activity: Yes    Partners: Male    Birth control/protection: None, Surgical  Other Topics Concern  . Not on file  Social History Narrative  . Not on file   Social Determinants of Health   Financial Resource Strain:   . Difficulty of Paying Living Expenses: Not on file  Food Insecurity:   . Worried About Charity fundraiser in the Last Year: Not on file  . Ran Out of Food in the Last Year: Not on file  Transportation Needs:    . Lack of Transportation (Medical): Not on file  . Lack of Transportation (Non-Medical): Not on file  Physical Activity:   . Days of Exercise per Week: Not on file  . Minutes of Exercise per Session: Not on file  Stress:   . Feeling of Stress : Not on file  Social Connections:   . Frequency of Communication with Friends and Family: Not on file  . Frequency of Social Gatherings with Friends and Family: Not on file  . Attends Religious Services: Not on file  . Active Member of Clubs or Organizations: Not on file  . Attends Archivist Meetings: Not on file  . Marital Status: Not on file    Family History        Family History  Problem Relation Age of Onset  . Cancer Mother        ovarian  . Stroke Mother        possibly a small stroke  . Depression Mother        Bipolar  . Diabetes Father   . Hyperlipidemia Father   . Hypertension Father   . Cancer Father        stomach cancer  . Cancer Paternal Grandfather  lung cancer, died at age 14  . Diabetes Maternal Grandmother   . Gout Maternal Grandmother   . Stroke Sister        fraction strokes  . Endometriosis Sister   . Migraines Sister   . Heart failure Paternal Grandmother   . COPD Neg Hx   . Heart disease Neg Hx     Patient Active Problem List   Diagnosis Date Noted  . Irritability 07/25/2018  . Left flank pain 05/28/2017  . Impingement syndrome, shoulder 07/20/2015  . Perimenopausal vasomotor symptoms 06/10/2015  . Complex cyst of left ovary 03/14/2015  . Family history of ovarian cancer 03/03/2015  . Preventative health care 03/03/2015  . Pelvic fullness in female 03/03/2015  . Vitamin D deficiency disease     Past Surgical History:  Procedure Laterality Date  . ABDOMINAL HYSTERECTOMY     partial  . CESAREAN SECTION     x 3  . DILATION AND CURETTAGE OF UTERUS       Current Outpatient Medications:  .  Ascorbic Acid (VITAMIN C ADULT GUMMIES PO), Take by mouth daily., Disp: , Rfl:  .   Cholecalciferol (VITAMIN D3) 5000 UNITS TABS, Take 5,000 Units by mouth daily., Disp: , Rfl:  .  Coenzyme Q10 (COQ-10) 10 MG CAPS, Take 1 capsule by mouth daily., Disp: , Rfl:  .  EPINEPHrine 0.3 mg/0.3 mL IJ SOAJ injection, Inject 0.3 mLs (0.3 mg total) into the muscle as needed for anaphylaxis (for life-threatening allergic reaction)., Disp: 1 Device, Rfl: 1 .  Multiple Vitamins-Minerals (HAIR SKIN AND NAILS FORMULA PO), Take by mouth daily., Disp: , Rfl:  .  vitamin B-12 (CYANOCOBALAMIN) 250 MCG tablet, Take 250 mcg by mouth daily., Disp: , Rfl:  .  VITAMIN E PO, Take 500 mg by mouth daily., Disp: , Rfl:   Allergies  Allergen Reactions  . Coconut Oil Swelling  . Penicillins Anaphylaxis  . Macrobid [Nitrofurantoin]     Patient Care Team: Delsa Grana, PA-C as PCP - General (Family Medicine)  Review of Systems  Constitutional: Negative.  Negative for activity change, appetite change, fatigue and unexpected weight change.  HENT: Negative.   Eyes: Negative.   Respiratory: Negative.  Negative for shortness of breath.   Cardiovascular: Negative.  Negative for chest pain, palpitations and leg swelling.  Gastrointestinal: Negative.  Negative for abdominal pain and blood in stool.  Endocrine: Negative.   Genitourinary: Negative.   Musculoskeletal: Negative.  Negative for arthralgias, gait problem, joint swelling and myalgias.  Skin: Negative.  Negative for color change, pallor and rash.  Allergic/Immunologic: Negative.   Neurological: Negative.  Negative for syncope and weakness.  Hematological: Negative.   Psychiatric/Behavioral: Negative.  Negative for confusion, dysphoric mood, self-injury and suicidal ideas. The patient is not nervous/anxious.      I personally reviewed active problem list, medication list, allergies, family history, social history, health maintenance, notes from last encounter, lab results, imaging with the patient/caregiver today.       Objective:   Vitals:    Vitals:   10/02/19 0910  BP: 132/86  Pulse: (!) 102  Resp: 14  Temp: 97.8 F (36.6 C)  SpO2: 98%  Weight: 162 lb 6.4 oz (73.7 kg)  Height: 5' 8" (1.727 m)    Body mass index is 24.69 kg/m.  Physical Exam Vitals and nursing note reviewed.  Constitutional:      General: She is not in acute distress.    Appearance: Normal appearance. She is well-developed. She is not  ill-appearing, toxic-appearing or diaphoretic.     Interventions: Face mask in place.  HENT:     Head: Normocephalic and atraumatic.     Right Ear: External ear normal.     Left Ear: External ear normal.  Eyes:     General: Lids are normal. No scleral icterus.       Right eye: No discharge.        Left eye: No discharge.     Conjunctiva/sclera: Conjunctivae normal.  Neck:     Trachea: Phonation normal. No tracheal deviation.  Cardiovascular:     Rate and Rhythm: Normal rate and regular rhythm.     Pulses: Normal pulses.          Radial pulses are 2+ on the right side and 2+ on the left side.       Posterior tibial pulses are 2+ on the right side and 2+ on the left side.     Heart sounds: Normal heart sounds. No murmur. No friction rub. No gallop.   Pulmonary:     Effort: Pulmonary effort is normal. No respiratory distress.     Breath sounds: Normal breath sounds. No stridor. No wheezing, rhonchi or rales.  Chest:     Chest wall: No tenderness.  Abdominal:     General: Bowel sounds are normal. There is no distension.     Palpations: Abdomen is soft.     Tenderness: There is no abdominal tenderness. There is no guarding or rebound.  Musculoskeletal:        General: No deformity. Normal range of motion.     Cervical back: Normal range of motion and neck supple.     Right lower leg: No edema.     Left lower leg: No edema.  Lymphadenopathy:     Cervical: No cervical adenopathy.  Skin:    General: Skin is warm and dry.     Capillary Refill: Capillary refill takes less than 2 seconds.     Coloration: Skin  is not jaundiced or pale.     Findings: No rash.  Neurological:     Mental Status: She is alert and oriented to person, place, and time.     Motor: No abnormal muscle tone.     Gait: Gait normal.  Psychiatric:        Speech: Speech normal.        Behavior: Behavior normal.       Fall Risk: Fall Risk  10/02/2019 09/05/2018 07/25/2018 05/14/2018 06/18/2017  Falls in the past year? 0 0 0 No No  Number falls in past yr: 0 0 0 - -  Injury with Fall? 0 0 0 - -    Functional Status Survey: Is the patient deaf or have difficulty hearing?: Yes Does the patient have difficulty seeing, even when wearing glasses/contacts?: No Does the patient have difficulty concentrating, remembering, or making decisions?: No Does the patient have difficulty walking or climbing stairs?: No Does the patient have difficulty dressing or bathing?: No Does the patient have difficulty doing errands alone such as visiting a doctor's office or shopping?: No   Assessment & Plan:    CPE completed today  . USPSTF grade A and B recommendations reviewed with patient; age-appropriate recommendations, preventive care, screening tests, etc discussed and encouraged; healthy living encouraged; see AVS for patient education given to patient  . Discussed importance of 150 minutes of physical activity weekly, AHA exercise recommendations given to pt in AVS/handout  . Discussed importance of healthy diet:  eating lean meats and proteins, avoiding trans fats and saturated fats, avoid simple sugars and excessive carbs in diet, eat 6 servings of fruit/vegetables daily and drink plenty of water and avoid sweet beverages.    . Recommended pt to do annual eye exam and routine dental exams/cleanings  . Depression, alcohol, fall screening completed as documented above and per flowsheets  . Reviewed Health Maintenance: Health Maintenance  Topic Date Due  . PAP SMEAR-Modifier  01/28/1991  . MAMMOGRAM  03/22/2016  . TETANUS/TDAP   08/06/2017  . INFLUENZA VACCINE  11/04/2019 (Originally 03/07/2019)  . HIV Screening  Completed    . Immunizations: Immunization History  Administered Date(s) Administered  . Tdap 08/07/2007     ICD-10-CM   1. Adult general medical exam  Z00.00 CBC with Differential/Platelet    COMPLETE METABOLIC PANEL WITH GFR    Lipid panel    Hemoglobin A1c    TSH  2. Screening for endocrine, metabolic and immunity disorder  Z13.29 CBC with Differential/Platelet   B14.782 COMPLETE METABOLIC PANEL WITH GFR   Z13.0 Lipid panel  3. Screening for lipoid disorders  Z13.220 Lipid panel  4. Screening for malignant neoplasm of cervix  Z12.4   5. Encounter for screening mammogram for malignant neoplasm of breast  Z12.31   6. Need for tetanus booster  Z23 Tdap vaccine greater than or equal to 7yo IM    She is very concerned about weight changes and body changes over the past couple years, we discussed at length diet and exercise, strengthening muscles with light lifting/lunges/weight bearing exercises for bone strength and increased muscle tone/mass which will help metabolisms, she needs to cut out sugar in drinks/soda and push clear no-calorie liquids, add protein fruit and veggies into diet - and work on cholesterol lowering diet and lifestyle changes -  Do not know if she will be approved for any dietician consult/nutritional counseling cause her BMI is normal, but she does have great work resources so I encouraged her to ask there  Delsa Grana, PA-C 10/02/19 9:45 AM  [place]

## 2019-10-02 NOTE — Patient Instructions (Signed)
Preventing Osteoporosis, Adult Osteoporosis is a condition that causes the bones to lose density. This means that the bones become thinner, and the normal spaces in bone tissue become larger. Low bone density can make the bones weak and cause them to break more easily. Osteoporosis cannot always be prevented, but you can take steps to lower your risk of developing this condition. How can this condition affect me? If you develop osteoporosis, you will be more likely to break bones in your wrist, spine, or hip. Even a minor accident or injury can be enough to break weak bones. The bones will also be slower to heal. Osteoporosis can cause other problems as well, such as a stooped posture or trouble with movement. Osteoporosis can occur with aging. As you get older, you may lose bone tissue more quickly, or it may be replaced more slowly. Osteoporosis is more likely to develop if you have poor nutrition or do not get enough calcium or vitamin D. Other lifestyle factors can also play a role. By eating a well-balanced diet and making lifestyle changes, you can help keep your bones strong and healthy, lowering your chances of developing osteoporosis. What can increase my risk? The following factors may make you more likely to develop osteoporosis:  Having a family history of the condition.  Having poor nutrition or not getting enough calcium or vitamin D.  Using certain medicines, such as steroid medicines or antiseizure medicines.  Being any of the following: ? 24 years of age or older. ? Female. ? A woman who has gone through menopause (is postmenopausal). ? White (Caucasian) or of Asian descent.  Smoking or having a history of smoking.  Not being physically active (being sedentary).  Having a small body frame. What actions can I take to prevent this?  Get enough calcium   Make sure you get enough calcium every day. Calcium is the most important mineral for bone health. Most people can get  enough calcium from their diet, but supplements may be recommended for people who are at risk for osteoporosis. Follow these guidelines: ? If you are age 3 or younger, aim to get 1,000 mg of calcium every day. ? If you are older than age 92, aim to get 1,200 mg of calcium every day.  Good sources of calcium include: ? Dairy products, such as low-fat or nonfat milk, cheese, and yogurt. ? Dark green leafy vegetables, such as bok choy and broccoli. ? Foods that have had calcium added to them (calcium-fortified foods), such as orange juice, cereal, bread, soy beverages, and tofu products. ? Nuts, such as almonds.  Check nutrition labels to see how much calcium is in a food or drink. Get enough vitamin D  Try to get enough vitamin D every day. Vitamin D is the most essential vitamin for bone health. It helps the body absorb calcium. Follow these guidelines for how much vitamin D to get from food: ? If you are age 79 or younger, aim to get at least 600 international units (IU) every day. Your health care provider may suggest more. ? If you are older than age 70, aim to get at least 800 international units every day. Your health care provider may suggest more.  Good sources of vitamin D in your diet include: ? Egg yolks. ? Oily fish, such as salmon, sardines, and tuna. ? Milk and cereal fortified with vitamin D.  Your body also makes vitamin D when you are out in the sun. Exposing the bare  skin on your face, arms, legs, or back to the sun for no more than 30 minutes a day, 2 times a week is more than enough. Beyond that, make sure you use sunblock to protect your skin from sunburn, which increases your risk for skin cancer. Exercise  Stay active and get exercise every day.  Ask your health care provider what types of exercise are best for you. Weight-bearing and strength-building activities are important for building and maintaining healthy bones. Some examples of these types of activities  include: ? Walking and hiking. ? Jogging and running. ? Dancing. ? Gym exercises. ? Lifting weights. ? Tennis and racquetball. ? Climbing stairs. ? Aerobics. Make other lifestyle changes  Do not use any products that contain nicotine or tobacco, such as cigarettes, e-cigarettes, and chewing tobacco. If you need help quitting, ask your health care provider.  Lose weight if you are overweight.  If you drink alcohol: ? Limit how much you use to:  0-1 drink a day for nonpregnant women.  0-2 drinks a day for men. ? Be aware of how much alcohol is in your drink. In the [place] one drink equals one 12 oz bottle of beer (355 mL), one 5 oz glass of wine (148 mL), or one 1 oz glass of hard liquor (44 mL). Where to find support If you need help making changes to prevent osteoporosis, talk with your health care provider. You can ask for a referral to a diet and nutrition specialist (dietitian) and a physical therapist. Where to find more information Learn more about osteoporosis from:  NIH Osteoporosis and Related [place]: www.bones.SouthExposed.es  [place] Office on [employer]: [place].tn  [place]: EquipmentWeekly.com.ee Summary  Osteoporosis is a condition that causes weak bones that are more likely to break.  Eat a healthy diet, making sure you get enough calcium and vitamin D, and stay active by getting regular exercise to help prevent osteoporosis.  Other ways to reduce your risk of osteoporosis include maintaining a healthy weight and avoiding alcohol and products that contain nicotine or tobacco. This information is not intended to replace advice given to you by your health care provider. Make sure you discuss any questions you have with your health care provider. Document Revised: 02/20/2019 Document Reviewed: 02/20/2019 Elsevier Patient Education  2020 Elsevier Inc.   Preventive Care 11-30 Years Old, Female Preventive care  refers to visits with your health care provider and lifestyle choices that can promote health and wellness. This includes:  A yearly physical exam. This may also be called an annual well check.  Regular dental visits and eye exams.  Immunizations.  Screening for certain conditions.  Healthy lifestyle choices, such as eating a healthy diet, getting regular exercise, not using drugs or products that contain nicotine and tobacco, and limiting alcohol use. What can I expect for my preventive care visit? Physical exam Your health care provider will check your:  Height and weight. This may be used to calculate body mass index (BMI), which tells if you are at a healthy weight.  Heart rate and blood pressure.  Skin for abnormal spots. Counseling Your health care provider may ask you questions about your:  Alcohol, tobacco, and drug use.  Emotional well-being.  Home and relationship well-being.  Sexual activity.  Eating habits.  Work and work Statistician.  Method of birth control.  Menstrual cycle.  Pregnancy history. What immunizations do I need?  Influenza (flu) vaccine  This is recommended every year.  Tetanus, diphtheria, and pertussis (Tdap) vaccine  You may need a Td booster every 10 years. Varicella (chickenpox) vaccine  You may need this if you have not been vaccinated. Zoster (shingles) vaccine  You may need this after age 3. Measles, mumps, and rubella (MMR) vaccine  You may need at least one dose of MMR if you were born in 1957 or later. You may also need a second dose. Pneumococcal conjugate (PCV13) vaccine  You may need this if you have certain conditions and were not previously vaccinated. Pneumococcal polysaccharide (PPSV23) vaccine  You may need one or two doses if you smoke cigarettes or if you have certain conditions. Meningococcal conjugate (MenACWY) vaccine  You may need this if you have certain conditions. Hepatitis A vaccine  You may  need this if you have certain conditions or if you travel or work in places where you may be exposed to hepatitis A. Hepatitis B vaccine  You may need this if you have certain conditions or if you travel or work in places where you may be exposed to hepatitis B. Haemophilus influenzae type b (Hib) vaccine  You may need this if you have certain conditions. Human papillomavirus (HPV) vaccine  If recommended by your health care provider, you may need three doses over 6 months. You may receive vaccines as individual doses or as more than one vaccine together in one shot (combination vaccines). Talk with your health care provider about the risks and benefits of combination vaccines. What tests do I need? Blood tests  Lipid and cholesterol levels. These may be checked every 5 years, or more frequently if you are over 20 years old.  Hepatitis C test.  Hepatitis B test. Screening  Lung cancer screening. You may have this screening every year starting at age 7 if you have a 30-pack-year history of smoking and currently smoke or have quit within the past 15 years.  Colorectal cancer screening. All adults should have this screening starting at age 34 and continuing until age 44. Your health care provider may recommend screening at age 45 if you are at increased risk. You will have tests every 1-10 years, depending on your results and the type of screening test.  Diabetes screening. This is done by checking your blood sugar (glucose) after you have not eaten for a while (fasting). You may have this done every 1-3 years.  Mammogram. This may be done every 1-2 years. Talk with your health care provider about when you should start having regular mammograms. This may depend on whether you have a family history of breast cancer.  BRCA-related cancer screening. This may be done if you have a family history of breast, ovarian, tubal, or peritoneal cancers.  Pelvic exam and Pap test. This may be done  every 3 years starting at age 65. Starting at age 74, this may be done every 5 years if you have a Pap test in combination with an HPV test. Other tests  Sexually transmitted disease (STD) testing.  Bone density scan. This is done to screen for osteoporosis. You may have this scan if you are at high risk for osteoporosis. Follow these instructions at home: Eating and drinking  Eat a diet that includes fresh fruits and vegetables, whole grains, lean protein, and low-fat dairy.  Take vitamin and mineral supplements as recommended by your health care provider.  Do not drink alcohol if: ? Your health care provider tells you not to drink. ? You are pregnant, may be pregnant, or  are planning to become pregnant.  If you drink alcohol: ? Limit how much you have to 0-1 drink a day. ? Be aware of how much alcohol is in your drink. In the [place] one drink equals one 12 oz bottle of beer (355 mL), one 5 oz glass of wine (148 mL), or one 1 oz glass of hard liquor (44 mL). Lifestyle  Take daily care of your teeth and gums.  Stay active. Exercise for at least 30 minutes on 5 or more days each week.  Do not use any products that contain nicotine or tobacco, such as cigarettes, e-cigarettes, and chewing tobacco. If you need help quitting, ask your health care provider.  If you are sexually active, practice safe sex. Use a condom or other form of birth control (contraception) in order to prevent pregnancy and STIs (sexually transmitted infections).  If told by your health care provider, take low-dose aspirin daily starting at age 65. What's next?  Visit your health care provider once a year for a well check visit.  Ask your health care provider how often you should have your eyes and teeth checked.  Stay up to date on all vaccines. This information is not intended to replace advice given to you by your health care provider. Make sure you discuss any questions you have with your health care  provider. Document Revised: 04/03/2018 Document Reviewed: 04/03/2018 Elsevier Patient Education  2020 [employer].

## 2019-10-03 LAB — CBC WITH DIFFERENTIAL/PLATELET
Absolute Monocytes: 491 cells/uL (ref 200–950)
Basophils Absolute: 38 cells/uL (ref 0–200)
Basophils Relative: 0.6 %
Eosinophils Absolute: 139 cells/uL (ref 15–500)
Eosinophils Relative: 2.2 %
HCT: 39.2 % (ref 35.0–45.0)
Hemoglobin: 13.2 g/dL (ref 11.7–15.5)
Lymphs Abs: 1682 cells/uL (ref 850–3900)
MCH: 29.1 pg (ref 27.0–33.0)
MCHC: 33.7 g/dL (ref 32.0–36.0)
MCV: 86.5 fL (ref 80.0–100.0)
MPV: 12.6 fL — ABNORMAL HIGH (ref 7.5–12.5)
Monocytes Relative: 7.8 %
Neutro Abs: 3950 cells/uL (ref 1500–7800)
Neutrophils Relative %: 62.7 %
Platelets: 228 10*3/uL (ref 140–400)
RBC: 4.53 10*6/uL (ref 3.80–5.10)
RDW: 12 % (ref 11.0–15.0)
Total Lymphocyte: 26.7 %
WBC: 6.3 10*3/uL (ref 3.8–10.8)

## 2019-10-03 LAB — LIPID PANEL
Cholesterol: 288 mg/dL — ABNORMAL HIGH (ref <200)
HDL: 45 mg/dL — ABNORMAL LOW (ref >=50)
LDL Cholesterol (Calc): 193 mg/dL (calc) — ABNORMAL HIGH
Non-HDL Cholesterol (Calc): 243 mg/dL (calc) — ABNORMAL HIGH (ref <130)
Total CHOL/HDL Ratio: 6.4 (calc) — ABNORMAL HIGH (ref <5.0)
Triglycerides: 294 mg/dL — ABNORMAL HIGH (ref <150)

## 2019-10-03 LAB — COMPLETE METABOLIC PANEL WITH GFR
AG Ratio: 1.6 (calc) (ref 1.0–2.5)
ALT: 18 U/L (ref 6–29)
AST: 16 U/L (ref 10–35)
Albumin: 4.2 g/dL (ref 3.6–5.1)
Alkaline phosphatase (APISO): 85 U/L (ref 31–125)
BUN: 13 mg/dL (ref 7–25)
CO2: 27 mmol/L (ref 20–32)
Calcium: 9.6 mg/dL (ref 8.6–10.2)
Chloride: 104 mmol/L (ref 98–110)
Creat: 0.67 mg/dL (ref 0.50–1.10)
GFR, Est African American: 120 mL/min/{1.73_m2} (ref >=60)
GFR, Est Non African American: 103 mL/min/{1.73_m2} (ref >=60)
Globulin: 2.6 g/dL (calc) (ref 1.9–3.7)
Glucose, Bld: 82 mg/dL (ref 65–99)
Potassium: 4 mmol/L (ref 3.5–5.3)
Sodium: 140 mmol/L (ref 135–146)
Total Bilirubin: 0.4 mg/dL (ref 0.2–1.2)
Total Protein: 6.8 g/dL (ref 6.1–8.1)

## 2019-10-03 LAB — HEMOGLOBIN A1C
Hgb A1c MFr Bld: 5.4 % of total Hgb (ref <5.7)
Mean Plasma Glucose: 108 (calc)
eAG (mmol/L): 6 (calc)

## 2019-10-03 LAB — TSH: TSH: 1.44 mIU/L

## 2019-10-05 ENCOUNTER — Other Ambulatory Visit: Payer: Self-pay | Admitting: Family Medicine

## 2019-10-05 DIAGNOSIS — E7801 Familial hypercholesterolemia: Secondary | ICD-10-CM

## 2019-10-05 MED ORDER — ROSUVASTATIN CALCIUM 20 MG PO TABS: 20.0000 mg | ORAL_TABLET | Freq: Every day | ORAL | 3 refills | Status: DC

## 2019-12-11 ENCOUNTER — Ambulatory Visit: Payer: BC Managed Care – PPO | Admitting: Family Medicine

## 2019-12-11 ENCOUNTER — Encounter: Payer: Self-pay | Admitting: Family Medicine

## 2019-12-11 ENCOUNTER — Other Ambulatory Visit: Payer: Self-pay

## 2019-12-11 VITALS — BP 126/78 | HR 94 | Temp 97.8°F | Resp 14 | Ht 68.0 in | Wt 164.3 lb

## 2019-12-11 DIAGNOSIS — E7801 Familial hypercholesterolemia: Secondary | ICD-10-CM | POA: Diagnosis not present

## 2019-12-11 DIAGNOSIS — Z1231 Encounter for screening mammogram for malignant neoplasm of breast: Secondary | ICD-10-CM

## 2019-12-11 DIAGNOSIS — Z5181 Encounter for therapeutic drug level monitoring: Secondary | ICD-10-CM | POA: Diagnosis not present

## 2019-12-11 NOTE — Progress Notes (Signed)
Name: Cassidy Lewis   MRN: [id number]    DOB: [birth year]   Date:12/11/2019       Progress Note  Chief Complaint  Patient presents with  . Follow-up  . Hyperlipidemia     Subjective:   Cassidy Lewis is a 49 y.o. female, presents to clinic for routine follow up on the conditions listed above.  Hyperlipidemia: Here for f/up on HLD n- she agreed to start statin after last OV with indication due to very high LDL Current Medication Regimen:  crestor 20 mg  Last Lipids: Lab Results  Component Value Date   CHOL 288 (H) 10/02/2019   HDL 45 (L) 10/02/2019   LDLCALC 193 (H) 10/02/2019   TRIG 294 (H) 10/02/2019   CHOLHDL 6.4 (H) 10/02/2019   - Current Diet:  No real change in diet or lifestyle  Still drinking a lot of water, decreased soda intake, not a big eater, one bigger meal a day and sometime snacking - Denies: Chest pain, shortness of breath, myalgias. - Documented aortic atherosclerosis? No - Risk factors for atherosclerosis: hypercholesterolemia   Patient Active Problem List   Diagnosis Date Noted  . Irritability 07/25/2018  . Left flank pain 05/28/2017  . Impingement syndrome, shoulder 07/20/2015  . Perimenopausal vasomotor symptoms 06/10/2015  . Complex cyst of left ovary 03/14/2015  . Family history of ovarian cancer 03/03/2015  . Preventative health care 03/03/2015  . Pelvic fullness in female 03/03/2015  . Vitamin D deficiency disease     Past Surgical History:  Procedure Laterality Date  . ABDOMINAL HYSTERECTOMY     partial  . CESAREAN SECTION     x 3  . DILATION AND CURETTAGE OF UTERUS      Family History  Problem Relation Age of Onset  . Cancer Mother        ovarian  . Stroke Mother        possibly a small stroke  . Depression Mother        Bipolar  . Diabetes Father   . Hyperlipidemia Father   . Hypertension Father   . Cancer Father        stomach cancer  . Cancer Paternal Grandfather        lung cancer, died at age 74  . Diabetes  Maternal Grandmother   . Gout Maternal Grandmother   . Stroke Sister        fraction strokes  . Endometriosis Sister   . Migraines Sister   . Heart failure Paternal Grandmother   . COPD Neg Hx   . Heart disease Neg Hx     Social History   Tobacco Use  . Smoking status: Never Smoker  . Smokeless tobacco: Never Used  Substance Use Topics  . Alcohol use: No    Comment: very rare  . Drug use: No      Current Outpatient Medications:  .  Ascorbic Acid (VITAMIN C ADULT GUMMIES PO), Take by mouth daily., Disp: , Rfl:  .  EPINEPHrine 0.3 mg/0.3 mL IJ SOAJ injection, Inject 0.3 mLs (0.3 mg total) into the muscle as needed for anaphylaxis (for life-threatening allergic reaction)., Disp: 1 Device, Rfl: 1 .  rosuvastatin (CRESTOR) 20 MG tablet, Take 1 tablet (20 mg total) by mouth daily., Disp: 90 tablet, Rfl: 3 .  vitamin B-12 (CYANOCOBALAMIN) 250 MCG tablet, Take 250 mcg by mouth daily., Disp: , Rfl:  .  VITAMIN E PO, Take 500 mg by mouth daily., Disp: , Rfl:   Allergies  Allergen Reactions  . Coconut Oil Swelling  . Penicillins Anaphylaxis  . Macrobid [Nitrofurantoin]     Chart Review Today: I personally reviewed active problem list, medication list, allergies, family history, social history, health maintenance, notes from last encounter, lab results, imaging with the patient/caregiver today.   Review of Systems  10 Systems reviewed and are negative for acute change except as noted in the HPI.  Objective:    Vitals:   12/11/19 0855  BP: 126/78  Pulse: 94  Resp: 14  Temp: 97.8 F (36.6 C)  SpO2: 99%  Weight: 164 lb 4.8 oz (74.5 kg)  Height: 5\' 8"  (1.727 m)    Body mass index is 24.98 kg/m.  Physical Exam Vitals and nursing note reviewed.  Constitutional:      Appearance: She is well-developed.  HENT:     Head: Normocephalic and atraumatic.     Nose: Nose normal.  Eyes:     General: No scleral icterus.       Right eye: No discharge.        Left eye: No  discharge.     Conjunctiva/sclera: Conjunctivae normal.  Neck:     Trachea: No tracheal deviation.  Cardiovascular:     Rate and Rhythm: Normal rate and regular rhythm.     Pulses: Normal pulses.     Heart sounds: Normal heart sounds. No murmur. No friction rub. No gallop.   Pulmonary:     Effort: Pulmonary effort is normal. No respiratory distress.     Breath sounds: Normal breath sounds. No stridor.  Abdominal:     General: Bowel sounds are normal.     Palpations: Abdomen is soft.  Musculoskeletal:     Right lower leg: No edema.     Left lower leg: No edema.  Skin:    General: Skin is warm and dry.     Coloration: Skin is not jaundiced or pale.     Findings: No rash.  Neurological:     Mental Status: She is alert.     Motor: No abnormal muscle tone.     Coordination: Coordination normal.  Psychiatric:        Behavior: Behavior normal.      PHQ2/9: Depression screen [facility] [place] 2/9 12/11/2019 10/02/2019 09/05/2018 07/25/2018 06/18/2017  Decreased Interest 0 0 0 0 0  Down, Depressed, Hopeless 0 0 0 0 0  PHQ - 2 Score 0 0 0 0 0  Altered sleeping 0 1 0 0 -  Tired, decreased energy 0 1 0 0 -  Change in appetite 0 0 0 0 -  Feeling bad or failure about yourself  0 0 0 0 -  Trouble concentrating 0 0 0 0 -  Moving slowly or fidgety/restless 0 0 0 0 -  Suicidal thoughts 0 0 0 0 -  PHQ-9 Score 0 2 0 0 -  Difficult doing work/chores Not difficult at all Not difficult at all Not difficult at all Not difficult at all -    phq 9 is neg, reviewed  Fall Risk: Fall Risk  12/11/2019 10/02/2019 09/05/2018 07/25/2018 05/14/2018  Falls in the past year? 0 0 0 0 No  Number falls in past yr: 0 0 0 0 -  Injury with Fall? 0 0 0 0 -    Functional Status Survey: Is the patient deaf or have difficulty hearing?: No Does the patient have difficulty seeing, even when wearing glasses/contacts?: No Does the patient have difficulty concentrating, remembering, or making decisions?: No Does the  patient have  difficulty walking or climbing stairs?: No Does the patient have difficulty dressing or bathing?: No Does the patient have difficulty doing errands alone such as visiting a doctor's office or shopping?: No   Assessment & Plan:     ICD-10-CM   1. Familial hypercholesterolemia  E78.01 COMPLETE METABOLIC PANEL WITH GFR    Lipid panel   started statin, tolerating crestor, not any other effort with diet or lifestyle, repeat labs here for monitor lipids and LFTs  2. Encounter for screening mammogram for malignant neoplasm of breast  Z12.31 MM 3D SCREEN BREAST BILATERAL  3. Encounter for medication monitoring  Z51.81 COMPLETE METABOLIC PANEL WITH GFR    Lipid panel    CBC with Differential/Platelet     Return for 6 month f/up HLD.   Danelle Berry, PA-C 12/11/19 9:10 AM

## 2019-12-12 LAB — CBC WITH DIFFERENTIAL/PLATELET
Absolute Monocytes: 436 cells/uL (ref 200–950)
Basophils Absolute: 20 cells/uL (ref 0–200)
Basophils Relative: 0.3 %
Eosinophils Absolute: 141 cells/uL (ref 15–500)
Eosinophils Relative: 2.1 %
HCT: 39.8 % (ref 35.0–45.0)
Hemoglobin: 13.2 g/dL (ref 11.7–15.5)
Lymphs Abs: 1688 cells/uL (ref 850–3900)
MCH: 28.9 pg (ref 27.0–33.0)
MCHC: 33.2 g/dL (ref 32.0–36.0)
MCV: 87.1 fL (ref 80.0–100.0)
MPV: 12.8 fL — ABNORMAL HIGH (ref 7.5–12.5)
Monocytes Relative: 6.5 %
Neutro Abs: 4415 cells/uL (ref 1500–7800)
Neutrophils Relative %: 65.9 %
Platelets: 210 10*3/uL (ref 140–400)
RBC: 4.57 10*6/uL (ref 3.80–5.10)
RDW: 11.8 % (ref 11.0–15.0)
Total Lymphocyte: 25.2 %
WBC: 6.7 10*3/uL (ref 3.8–10.8)

## 2019-12-12 LAB — LIPID PANEL
Cholesterol: 183 mg/dL (ref <200)
HDL: 48 mg/dL — ABNORMAL LOW (ref >=50)
LDL Cholesterol (Calc): 100 mg/dL (calc) — ABNORMAL HIGH
Non-HDL Cholesterol (Calc): 135 mg/dL (calc) — ABNORMAL HIGH (ref <130)
Total CHOL/HDL Ratio: 3.8 (calc) (ref <5.0)
Triglycerides: 229 mg/dL — ABNORMAL HIGH (ref <150)

## 2019-12-12 LAB — COMPLETE METABOLIC PANEL WITH GFR
AG Ratio: 1.4 (calc) (ref 1.0–2.5)
ALT: 18 U/L (ref 6–29)
AST: 15 U/L (ref 10–35)
Albumin: 4.2 g/dL (ref 3.6–5.1)
Alkaline phosphatase (APISO): 85 U/L (ref 31–125)
BUN: 16 mg/dL (ref 7–25)
CO2: 28 mmol/L (ref 20–32)
Calcium: 9.8 mg/dL (ref 8.6–10.2)
Chloride: 105 mmol/L (ref 98–110)
Creat: 0.62 mg/dL (ref 0.50–1.10)
GFR, Est African American: 123 mL/min/{1.73_m2} (ref >=60)
GFR, Est Non African American: 106 mL/min/{1.73_m2} (ref >=60)
Globulin: 2.9 g/dL (calc) (ref 1.9–3.7)
Glucose, Bld: 98 mg/dL (ref 65–99)
Potassium: 4.7 mmol/L (ref 3.5–5.3)
Sodium: 140 mmol/L (ref 135–146)
Total Bilirubin: 0.4 mg/dL (ref 0.2–1.2)
Total Protein: 7.1 g/dL (ref 6.1–8.1)

## 2020-04-07 ENCOUNTER — Telehealth: Payer: Self-pay

## 2020-04-07 NOTE — Telephone Encounter (Signed)
Copied from CRM [id number]. Topic: General - Other >> Apr 07, 2020  9:25 AM Tamela Oddi wrote: Reason for CRM: Patient would like a call back from the nurse regarding some questions on covid for her husband.  She stated that all her questions were not answered from speaking with the office yesterday.  CB# [phone] >> Apr 07, 2020 10:01 AM Marcos Eke, CMA wrote: Wife was told that due to pt having high cholosterol and some low kidney function, but not at the point of needing specialist he would not qualify for a note to excuse him from covid vaccine and that our providers recomend.  I did offer an appointment to discuss further with provider.  Pt wife refused.

## 2020-06-17 ENCOUNTER — Ambulatory Visit: Payer: BC Managed Care – PPO | Admitting: Family Medicine

## 2020-11-04 ENCOUNTER — Ambulatory Visit: Payer: BC Managed Care – PPO | Admitting: Family Medicine

## 2020-11-11 ENCOUNTER — Encounter: Payer: Self-pay | Admitting: Family Medicine

## 2020-11-11 ENCOUNTER — Ambulatory Visit: Payer: BC Managed Care – PPO | Admitting: Family Medicine

## 2020-11-11 ENCOUNTER — Other Ambulatory Visit: Payer: Self-pay

## 2020-11-11 VITALS — BP 126/72 | HR 96 | Temp 98.0°F | Resp 16 | Ht 68.0 in | Wt 167.8 lb

## 2020-11-11 DIAGNOSIS — M542 Cervicalgia: Secondary | ICD-10-CM

## 2020-11-11 DIAGNOSIS — J329 Chronic sinusitis, unspecified: Secondary | ICD-10-CM

## 2020-11-11 DIAGNOSIS — E7801 Familial hypercholesterolemia: Secondary | ICD-10-CM | POA: Insufficient documentation

## 2020-11-11 DIAGNOSIS — R29818 Other symptoms and signs involving the nervous system: Secondary | ICD-10-CM | POA: Diagnosis not present

## 2020-11-11 DIAGNOSIS — M549 Dorsalgia, unspecified: Secondary | ICD-10-CM

## 2020-11-11 DIAGNOSIS — Z5181 Encounter for therapeutic drug level monitoring: Secondary | ICD-10-CM | POA: Diagnosis not present

## 2020-11-11 DIAGNOSIS — R066 Hiccough: Secondary | ICD-10-CM

## 2020-11-11 DIAGNOSIS — R635 Abnormal weight gain: Secondary | ICD-10-CM

## 2020-11-11 DIAGNOSIS — R519 Headache, unspecified: Secondary | ICD-10-CM

## 2020-11-11 DIAGNOSIS — E781 Pure hyperglyceridemia: Secondary | ICD-10-CM

## 2020-11-11 DIAGNOSIS — R0683 Snoring: Secondary | ICD-10-CM

## 2020-11-11 DIAGNOSIS — J310 Chronic rhinitis: Secondary | ICD-10-CM

## 2020-11-11 MED ORDER — ROSUVASTATIN CALCIUM 20 MG PO TABS: 20.0000 mg | ORAL_TABLET | Freq: Every day | ORAL | 3 refills | Status: DC

## 2020-11-11 MED ORDER — NORTRIPTYLINE HCL 10 MG PO CAPS: 10.0000 mg | ORAL_CAPSULE | Freq: Every day | ORAL | 1 refills | Status: DC

## 2020-11-11 MED ORDER — PANTOPRAZOLE SODIUM 40 MG PO TBEC: 40.0000 mg | DELAYED_RELEASE_TABLET | Freq: Every day | ORAL | 1 refills | Status: DC

## 2020-11-11 MED ORDER — BACLOFEN 10 MG PO TABS
10.0000 mg | ORAL_TABLET | Freq: Three times a day (TID) | ORAL | 1 refills | Status: DC | PRN
Start: 2020-11-11 — End: 2021-06-08

## 2020-11-11 NOTE — Patient Instructions (Addendum)
Start protonix daily and see if in the next 2+ weeks it helps with hiccups - if no improvement please let me know and we need to do a follow up visit  You have severe tension in shoulders up to neck, you have swollen inflamed and red nasal tissue and likely several reasons to have headaches often  Try to use a heating pad and gently stretch neck/shoulders Use steroid nasal spray daily and try a daily antihistamine like claritin or zyrtec to decrease sinus and nasal swelling  We will try and get a sleep study eval  Try daily medications at bedtime to see if it helps reduce your frequency and intensity of headaches  General Headache Without Cause A headache is pain or discomfort that is felt around the head or neck area. There are many causes and types of headaches. In some cases, the cause may not be found. Follow these instructions at home: Watch your condition for any changes. Let your doctor know about them. Take these steps to help with your condition: Managing pain  Take over-the-counter and prescription medicines only as told by your doctor.  Lie down in a dark, quiet room when you have a headache.  If told, put ice on your head and neck area: ? Put ice in a plastic bag. ? Place a towel between your skin and the bag. ? Leave the ice on for 20 minutes, 2-3 times per day.  If told, put heat on the affected area. Use the heat source that your doctor recommends, such as a moist heat pack or a heating pad. ? Place a towel between your skin and the heat source. ? Leave the heat on for 20-30 minutes. ? Remove the heat if your skin turns bright red. This is very important if you are unable to feel pain, heat, or cold. You may have a greater risk of getting burned.  Keep lights dim if bright lights bother you or make your headaches worse.      Eating and drinking  Eat meals on a regular schedule.  If you drink alcohol: ? Limit how much you use to:  0-1 drink a day for  women.  0-2 drinks a day for men. ? Be aware of how much alcohol is in your drink. In the [place] one drink equals one 12 oz bottle of beer (355 mL), one 5 oz glass of wine (148 mL), or one 1 oz glass of hard liquor (44 mL).  Stop drinking caffeine, or reduce how much caffeine you drink. General instructions  Keep a journal to find out if certain things bring on headaches. For example, write down: ? What you eat and drink. ? How much sleep you get. ? Any change to your diet or medicines.  Get a massage or try other ways to relax.  Limit stress.  Sit up straight. Do not tighten (tense) your muscles.  Do not use any products that contain nicotine or tobacco. This includes cigarettes, e-cigarettes, and chewing tobacco. If you need help quitting, ask your doctor.  Exercise regularly as told by your doctor.  Get enough sleep. This often means 7-9 hours of sleep each night.  Keep all follow-up visits as told by your doctor. This is important.   Contact a doctor if:  Your symptoms are not helped by medicine.  You have a headache that feels different than the other headaches.  You feel sick to your stomach (nauseous) or you throw up (vomit).  You have a  fever. Get help right away if:  Your headache gets very bad quickly.  Your headache gets worse after a lot of physical activity.  You keep throwing up.  You have a stiff neck.  You have trouble seeing.  You have trouble speaking.  You have pain in the eye or ear.  Your muscles are weak or you lose muscle control.  You lose your balance or have trouble walking.  You feel like you will pass out (faint) or you pass out.  You are mixed up (confused).  You have a seizure. Summary  A headache is pain or discomfort that is felt around the head or neck area.  There are many causes and types of headaches. In some cases, the cause may not be found.  Keep a journal to help find out what causes your headaches. Watch your  condition for any changes. Let your doctor know about them.  Contact a doctor if you have a headache that is different from usual, or if your headache is not helped by medicine.  Get help right away if your headache gets very bad, you throw up, you have trouble seeing, you lose your balance, or you have a seizure. This information is not intended to replace advice given to you by your health care provider. Make sure you discuss any questions you have with your health care provider. Document Revised: 02/10/2018 Document Reviewed: 02/10/2018 [employer] Patient Education  2021 [employer]   \ Tension Headache, Adult A tension headache is a feeling of pain, pressure, or aching in the head. It is often felt over the front and sides of the head. Tension headaches can last from 30 minutes to several days. What are the causes? The cause of this condition is not known. Sometimes, tension headaches are brought on by stress, worry (anxiety), or depression. Other things that may set them off include:  Alcohol.  Too much caffeine or caffeine withdrawal.  Colds, flu, or sinus infections.  Dental problems. This can include clenching your teeth.  Being tired.  Holding your head and neck in the same position for a long time, such as while using a computer.  Smoking.  Arthritis in the neck. What are the signs or symptoms?  Feeling pressure around the head.  A dull ache in the head.  Pain over the front and sides of the head.  Feeling sore or tender in the muscles of the head, neck, and shoulders. How is this treated? This condition may be treated with lifestyle changes and with medicines that help relieve symptoms. Follow these instructions at home: Managing pain  Take over-the-counter and prescription medicines only as told by your doctor.  When you have a headache, lie down in a dark, quiet room.  If told, put ice on your head and neck. To do this: ? Put ice in a plastic  bag. ? Place a towel between your skin and the bag. ? Leave the ice on for 20 minutes, 2-3 times a day. ? Take off the ice if your skin turns bright red. This is very important. If you cannot feel pain, heat, or cold, you have a greater risk of damage to the area.  If told, put heat on the back of your neck. Do this as often as told by your doctor. Use the heat source that your doctor recommends, such as a moist heat pack or a heating pad. ? Place a towel between your skin and the heat source. ? Leave the heat  on for 20-30 minutes. ? Take off the heat if your skin turns bright red. This is very important. If you cannot feel pain, heat, or cold, you have a greater risk of getting burned. Eating and drinking  Eat meals on a regular schedule.  If you drink alcohol: ? Limit how much you have to:  0-1 drink a day for women who are not pregnant.  0-2 drinks a day for men. ? Know how much alcohol is in your drink. In the [place] one drink equals one 12 oz bottle of beer (355 mL), one 5 oz glass of wine (148 mL), or one 1 oz glass of hard liquor (44 mL).  Drink enough fluid to keep your pee (urine) pale yellow.  Do not use a lot of caffeine, or stop using caffeine. Lifestyle  Get 7-9 hours of sleep each night. Or get the amount of sleep that your doctor tells you to.  At bedtime, keep computers, phones, and tablets out of your room.  Find ways to lessen your stress. This may include: ? Exercise. ? Deep breathing. ? Yoga. ? Listening to music. ? Thinking positive thoughts.  Sit up straight. Try to relax your muscles.  Do not smoke or use any products that contain nicotine or tobacco. If you need help quitting, ask your doctor. General instructions  Avoid things that can bring on headaches. Keep a headache journal to see what may bring on headaches. For example, write down: ? What you eat and drink. ? How much sleep you get. ? Any change to your diet or medicines.  Keep all  follow-up visits.   Contact a doctor if:  Your headache does not get better.  Your headache comes back.  You have a headache, and sounds, light, or smells bother you.  You feel like you may vomit, or you vomit.  Your stomach hurts. Get help right away if:  You all of a sudden get a very bad headache with any of these things: ? A stiff neck. ? Feeling like you may vomit. ? Vomiting. ? Feeling mixed up (confused). ? Feeling weak in one part or one side of your body. ? Having trouble seeing or speaking, or both. ? Feeling short of breath. ? A rash. ? Feeling very sleepy. ? Pain in your eye or ear. ? Trouble walking or balancing. ? Feeling like you will faint, or you faint. Summary  A tension headache is pain, pressure, or aching in your head.  Tension headaches can last from 30 minutes to several days.  Lifestyle changes and medicines may help relieve pain. This information is not intended to replace advice given to you by your health care provider. Make sure you discuss any questions you have with your health care provider. Document Revised: 04/21/2020 Document Reviewed: 04/21/2020 [employer] Patient Education  2021 [employer].

## 2020-11-11 NOTE — Progress Notes (Signed)
Patient ID: Cassidy Lewis, female    DOB: 04-26-1971, 50 y.o.   MRN: [id number]  PCP: Danelle Berry, PA-C  Chief Complaint  Patient presents with  . Migraine  . Hiccups    Subjective:   Cassidy Lewis is a 50 y.o. female, presents to clinic with CC of the following:  HPI  Hiccups - onset more than 3 months ago was coming and going but recently every day painful belching and hiccuping with eating, feels like shes got to belch and then hiccups start, She denies associated indigestion, abd pain, reflux, unintentional weight loss, N, V, bowel changes, CP SOB, dysphagia, graphesthesia, regurgitation, back pain.  She states she has had hernia repair surgery with C-section surgery.    HA's -  Pt reports hx of HA's that she managed with past PCP for many years, no specialty consult, no past prescription meds July/aug last year HA's worsened and became more chronic and severe, occurring at least 5/7 d a week  Had eyes checked no problem with rx Took length off hair/weight no change Stressful job works 2-3 am works 10+ hours, comes home in the afternoon sleep an hour and wakes up with huge HA, usually sleeps 7 pm and 2 am Previously managed with Dr. 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  But she doesn't feel like she has seasonal or chronic allergies The other day her pain was severe over left eye Not sleeping well and constantly feels exhausted Does snore Gaining weight Rarely has associated photophobia Denies associated N, V, numbness, tingeling, weakness, visual disturbances, near syncope, vertigo, gait/balance  issues    Hyperlipidemia: Currently treated with crestor 20, pt reports she stopped meds- probably only took in 2021 from March to May - Denies: Chest pain, shortness of breath, myalgias, claudication Reviewed last several years of lipid labs with pt today   Lab Results  Component Value Date   CHOL 183 12/11/2019   CHOL 288 (H) 10/02/2019   CHOL 240 (H) 07/25/2018   Lab Results  Component Value Date   HDL 48 (L) 12/11/2019   HDL 45 (L) 10/02/2019   HDL 42 (L) 07/25/2018   Lab Results  Component Value Date   LDLCALC 100 (H) 12/11/2019   LDLCALC 193 (H) 10/02/2019   LDLCALC 152 (H) 07/25/2018   Lab Results  Component Value Date   TRIG 229 (H) 12/11/2019   TRIG 294 (H) 10/02/2019   TRIG 282 (H) 07/25/2018   Lab Results  Component Value Date   CHOLHDL 3.8 12/11/2019   CHOLHDL 6.4 (H) 10/02/2019   CHOLHDL 5.7 (H) 07/25/2018   No results found for: LDLDIRECT  Wt Readings from Last 5 Encounters:  11/11/20 167 lb 12.8 oz (76.1 kg)  12/11/19 164 lb 4.8 oz (74.5 kg)  10/02/19 162 lb 6.4 oz (73.7 kg)  09/05/18 156 lb 11.2 oz (71.1 kg)  07/25/18 157 lb (71.2 kg)   BMI Readings from Last 5 Encounters:  11/11/20 25.51 kg/m  12/11/19 24.98 kg/m  10/02/19 24.69 kg/m  09/05/18 23.83 kg/m  07/25/18 23.87 kg/m   Poor sleep, snores, always exhausted, sometimes wakes up with headaches in am going to  work or after napping ESS:     Office Visit from 11/11/2020 in [facility]   11/11/2020   1556   Epworth Sleepiness Scale   Sitting and reading Slight chance of dozing  Watching TV Moderate chance of dozing  Sitting, inactive in a public place (e.g. a theatre or a meeting) Slight chance of dozing  As a passenger in a car for an hour without a break Moderate chance of dozing  Lying down to rest in the afternoon when circumstances permit Moderate chance of dozing  Sitting and talking to someone Slight chance of dozing  Sitting quietly after a lunch  without alcohol Slight chance of dozing  In a car, while stopped for a few minutes in traffic Would never doze  Total score 10        Patient Active Problem List   Diagnosis Date Noted  . Familial hypercholesterolemia 11/11/2020  . Impingement syndrome, shoulder 07/20/2015  . Perimenopausal vasomotor symptoms 06/10/2015  . Complex cyst of left ovary 03/14/2015  . Family history of ovarian cancer 03/03/2015  . Vitamin D deficiency disease       Current Outpatient Medications:  .  Ascorbic Acid (VITAMIN C ADULT GUMMIES PO), Take by mouth daily., Disp: , Rfl:  .  EPINEPHrine 0.3 mg/0.3 mL IJ SOAJ injection, Inject 0.3 mLs (0.3 mg total) into the muscle as needed for anaphylaxis (for life-threatening allergic reaction)., Disp: 1 Device, Rfl: 1 .  vitamin B-12 (CYANOCOBALAMIN) 250 MCG tablet, Take 250 mcg by mouth daily., Disp: , Rfl:  .  rosuvastatin (CRESTOR) 20 MG tablet, Take 1 tablet (20 mg total) by mouth daily. (Patient not taking: Reported on 11/11/2020), Disp: 90 tablet, Rfl: 3 .  VITAMIN E PO, Take 500 mg by mouth daily. (Patient not taking: Reported on 11/11/2020), Disp: , Rfl:    Allergies  Allergen Reactions  . Coconut Oil Swelling  . Penicillins Anaphylaxis  . Macrobid [Nitrofurantoin]      Social History   Tobacco Use  . Smoking status: Never Smoker  . Smokeless tobacco: Never Used  Vaping Use  . Vaping Use: Never used  Substance Use Topics  . Alcohol use: No    Comment: very rare  . Drug use: No      Chart Review Today: I personally reviewed active problem list, medication list, allergies, family history, social history, health maintenance, notes from last encounter, lab results, imaging with the patient/caregiver today.  Review of Systems  Constitutional: Positive for unexpected weight change. Negative for activity change, appetite change, chills, diaphoresis and fatigue.  HENT: Negative.   Eyes: Negative.  Negative for photophobia and visual  disturbance.  Respiratory: Negative.  Negative for cough, chest tightness, shortness of breath, wheezing and stridor.   Cardiovascular: Negative.  Negative for chest pain, palpitations and leg swelling.  Gastrointestinal: Negative.  Negative for abdominal distention, abdominal pain, constipation, diarrhea, nausea and vomiting.  Endocrine: Negative.   Genitourinary: Negative.   Musculoskeletal: Negative.   Skin: Negative.  Negative for color change, pallor and rash.  Allergic/Immunologic: Negative.   Neurological: Positive for headaches. Negative for dizziness, tremors, seizures, syncope, facial asymmetry, speech difficulty, weakness, light-headedness and numbness.  Hematological: Negative.   All other systems reviewed and are negative.  10 Systems reviewed and are negative for acute change except as noted in the HPI.     Objective:   Vitals:   11/11/20 1502  BP: 126/72  Pulse: 96  Resp: 16  Temp: 98 F (36.7 C)  SpO2: 97%  Weight: 167 lb 12.8 oz (76.1 kg)  Height:  (1.727 m)    Body mass index is 25.51 kg/m.  Physical Exam Vitals and nursing note reviewed.  Constitutional:      General: She is not in acute distress.    Appearance: Normal appearance. She is obese. She is not ill-appearing, toxic-appearing or diaphoretic.  HENT:     Head: Normocephalic and atraumatic.     Right Ear: Tympanic membrane, ear canal and external ear normal. There is no impacted cerumen.     Left Ear: Tympanic membrane, ear canal and external ear normal. There is no impacted cerumen.     Nose: Mucosal edema, congestion and rhinorrhea present.     Right Turbinates: Enlarged.     Left Turbinates: Enlarged.     Right Sinus: No maxillary sinus tenderness or frontal sinus tenderness.     Left Sinus: No maxillary sinus tenderness or frontal sinus tenderness.     Mouth/Throat:     Mouth: Mucous membranes are moist.     Pharynx: Oropharynx is clear. Uvula midline. Posterior oropharyngeal erythema  present. No pharyngeal swelling, oropharyngeal exudate or uvula swelling.     Tonsils: No tonsillar exudate or tonsillar abscesses. 2+ on the right. 2+ on the left.  Eyes:     General: Lids are normal. No scleral icterus.       Right eye: No discharge.        Left eye: No discharge.     Extraocular Movements: Extraocular movements intact.     Conjunctiva/sclera: Conjunctivae normal.     Pupils: Pupils are equal, round, and reactive to light.     Slit lamp exam:    Right eye: No photophobia.     Left eye: No photophobia.  Neck:     Trachea: Trachea and phonation normal.  Cardiovascular:     Rate and Rhythm: Normal rate and regular rhythm.     Pulses: Normal pulses.     Heart sounds: Normal heart sounds. No murmur heard. No friction rub. No gallop.   Pulmonary:     Effort: Pulmonary effort is normal. No respiratory distress.     Breath sounds: Normal breath sounds. No stridor. No wheezing, rhonchi or rales.  Chest:     Chest wall: No tenderness.  Abdominal:     General: Bowel sounds are normal. There is no distension.     Palpations: Abdomen is soft.     Tenderness: There is no abdominal tenderness. There is no guarding or rebound.  Musculoskeletal:     Cervical back: Normal range of motion. No rigidity. Muscular tenderness (upper trapezius and cervical paraspinal muscle decreased tension and mild tenderness to palpation) present. No spinous process tenderness. Normal range of motion.  Lymphadenopathy:     Cervical: No cervical adenopathy.  Skin:    General: Skin is warm and dry.     Coloration: Skin is not cyanotic, jaundiced or pale.     Findings: No bruising, erythema or lesion.  Neurological:     General: No focal deficit present.     Mental Status: She is alert.     Cranial Nerves: Cranial nerves are intact. No dysarthria or facial asymmetry.     Sensory: Sensation is intact. No sensory deficit.     Motor: Motor function is intact.     Coordination: Coordination is  intact.     Gait: Gait is intact.     Comments: CRANIAL NERVES:   II: Pupils equal and  reactive, no RAPD   III, IV, VI: EOM intact, no gaze preference or deviation, no nystagmus.   V: normal sensation in V1, V2, and V3 segments bilaterally   VII: no asymmetry, no nasolabial fold flattening   VIII: normal hearing to speech   IX, X: normal palatal elevation, no uvular deviation   XI: 5/5 head turn and 5/5 shoulder shrug bilaterally   XII: midline tongue protrusion  MOTOR:  5/5 bilateral grip strength 5/5 strength dorsiflexion/plantarflexion b/l  SENSORY:  Normal to light touch in all extremeties  GAIT: Normal   Psychiatric:        Mood and Affect: Mood normal.        Behavior: Behavior normal.      Results for orders placed or performed in visit on 12/11/19  COMPLETE METABOLIC PANEL WITH GFR  Result Value Ref Range   Glucose, Bld 98 65 - 99 mg/dL   BUN 16 7 - 25 mg/dL   Creat 4.090.62 8.110.50 - 9.141.10 mg/dL   GFR, Est Non African American 106 > OR = 60 mL/min/1.7773m2   GFR, Est African American 123 > OR = 60 mL/min/1.1173m2   BUN/Creatinine Ratio NOT APPLICABLE 6 - 22 (calc)   Sodium 140 135 - 146 mmol/L   Potassium 4.7 3.5 - 5.3 mmol/L   Chloride 105 98 - 110 mmol/L   CO2 28 20 - 32 mmol/L   Calcium 9.8 8.6 - 10.2 mg/dL   Total Protein 7.1 6.1 - 8.1 g/dL   Albumin 4.2 3.6 - 5.1 g/dL   Globulin 2.9 1.9 - 3.7 g/dL (calc)   AG Ratio 1.4 1.0 - 2.5 (calc)   Total Bilirubin 0.4 0.2 - 1.2 mg/dL   Alkaline phosphatase (APISO) 85 31 - 125 U/L   AST 15 10 - 35 U/L   ALT 18 6 - 29 U/L  Lipid panel  Result Value Ref Range   Cholesterol 183 <200 mg/dL   HDL 48 (L) > OR = 50 mg/dL   Triglycerides 782229 (H) <150 mg/dL   LDL Cholesterol (Calc) 100 (H) mg/dL (calc)   Total CHOL/HDL Ratio 3.8 <5.0 (calc)   Non-HDL Cholesterol (Calc) 135 (H) <130 mg/dL (calc)  CBC with Differential/Platelet  Result Value Ref Range   WBC 6.7 3.8 - 10.8 Thousand/uL   RBC 4.57 3.80 - 5.10 Million/uL    Hemoglobin 13.2 11.7 - 15.5 g/dL   HCT 95.639.8 21.335.0 - 08.645.0 %   MCV 87.1 80.0 - 100.0 fL   MCH 28.9 27.0 - 33.0 pg   MCHC 33.2 32.0 - 36.0 g/dL   RDW 57.811.8 46.911.0 - 62.915.0 %   Platelets 210 140 - 400 Thousand/uL   MPV 12.8 (H) 7.5 - 12.5 fL   Neutro Abs 4,415 1,500 - 7,800 cells/uL   Lymphs Abs 1,688 850 - 3,900 cells/uL   Absolute Monocytes 436 200 - 950 cells/uL   Eosinophils Absolute 141 15 - 500 cells/uL   Basophils Absolute 20 0 - 200 cells/uL   Neutrophils Relative % 65.9 %   Total Lymphocyte 25.2 %   Monocytes Relative 6.5 %   Eosinophils Relative 2.1 %   Basophils Relative 0.3 %       Assessment & Plan:     ICD-10-CM   1. Hiccups  R06.6 pantoprazole (PROTONIX) 40 MG tablet    DG Abd 1 View    baclofen (LIORESAL) 10 MG tablet   try ppi, KUB, baclofen may help prn, f/up if not improving in 2-4 weeks  2.  Nonintractable headache, unspecified chronicity pattern, unspecified headache type  R51.9 CBC with Differential/Platelet    Lipid panel    COMPLETE METABOLIC PANEL WITH GFR    TSH    nortriptyline (PAMELOR) 10 MG capsule    baclofen (LIORESAL) 10 MG tablet    Ambulatory referral to Neurology   likely multifactorial - tension, rhinosinusitis, stress, poor sleep, r/o OSA, trial nortriptyline and can try baclofen and heat tx for upper back neck tension  3. Familial hypercholesterolemia  E78.01 Lipid panel    COMPLETE METABOLIC PANEL WITH GFR    rosuvastatin (CRESTOR) 20 MG tablet   encouraged pt to resume crestor, reviewed her past labs and improvement with crestor, encouraged her to take again   4. Suspected sleep apnea  R29.818 Ambulatory referral to Neurology   Explained to patient that sleep study would rule out sleep apnea however sleep apnea may be causing some of her symptoms today  5. Snoring  R06.83 Ambulatory referral to Neurology   see below  6. Upper back pain  M54.9    Tension to upper back and upper trapezius likely holding a lot of stress and tension in the area  may be triggering headaches - see next for mgmt  7. Neck pain  M54.2    Upper back and posterior neck increased muscle tension encourage conservative management heat therapy, gentle stretching, muscle relaxers  8. Weight gain  R63.5 Lipid panel    COMPLETE METABOLIC PANEL WITH GFR    TSH   Unintentional weight gain rule out hypothyroid, rule out sleep apnea  9. Rhinosinusitis  J31.0    J32.9    Patient instructed to use a steroid nasal spray and a daily second-generation antihistamine  10. Encounter for medication monitoring  Z51.81 CBC with Differential/Platelet    Lipid panel    COMPLETE METABOLIC PANEL WITH GFR    TSH      UTD reviewed management and tx of hiccups: Pharmacotherapy--Pharmacotherapy is warranted for hiccups lasting more than 48 hours, unless an etiology that can be quickly corrected is discovered during evaluation (eg, a foreign body in the external auditory canal). A variety of medication classes are used for treatment of hiccups. Most are related to the dopaminergic or GABAergic pathways [10]. Treatment for prolonged hiccups (beyond simple physical maneuvers) includes medications with varying efficacy and potential for side effects, and occasionally, procedural approaches. There are insufficient randomized trials to definitively guide the choice among these treatments [57,58]. Much of the approach is based on observational studies, case reports, and small series that do not directly compare treatment options [8,59-61]. Initial medications--Initial medication may be targeted at a probable etiology or used empirically. ?If an etiology for hiccups is identified and is amenable to medication, a medication from the appropriate drug class should be chosen. For example, if GERD is recognized, a proton pump inhibitor (PPI) should be initiated. ?If a treatable cause for hiccups is not identified, medication may be used empirically. We suggest use of a PPI as empiric therapy and  trial other agents if this is unsuccessful. However, it may also be reasonable to choose baclofen, gabapentin, or metoclopramide as first-line therapy. A 2015 systematic review concluded that baclofen and gabapentin may be considered first-line therapy for intractable hiccups, followed by metoclopramide and chlorpromazine [62].  Discussed options with pt -her hiccups do seem associated with eating and with sensation of needing to belch we will try PPI and get a KUB to see if there is any evidence of hiatal hernia or  anything abnormal around the diaphragm.  Encouraged her to take Protonix daily and give it a try for at least 2 weeks to see if it improves her symptoms, she can try baclofen for her neck and back tension and also may help her hiccups Could try Reglan or Compazine next and they both may also help with her headaches?  Pt for HA to f/up in 2-4 weeks with HA journal -after trying nortriptyline, working on reducing stress and muscle tension also managing her sinuses and allergies, referral to neurology for further evaluation and to help rule out sleep apnea   Health Maintenance  Topic Date Due  . MAMMOGRAM  03/22/2016  . PAP SMEAR-Modifier  11/11/2020 (Originally 01/28/1991)  . COVID-19 Vaccine (1) 11/27/2020 (Originally [birth year]1975)  . COLONOSCOPY (Pts 45-30yrs Insurance coverage will need to be confirmed)  11/11/2021 (Originally 01/28/2015)  . Hepatitis C Screening  11/11/2021 (Originally 1970-08-11)  . INFLUENZA VACCINE  03/06/2021  . TETANUS/TDAP  10/01/2029  . HIV Screening  Completed  . HPV VACCINES  Aged Out   She has multiple things which need to be addressed and updated with her health maintenance including but not limited to cervical cancer screening, breast cancer screening, she has family history of colon cancer and also uterine cancer, she had a partial hysterectomy and had been seeing Dr. Valentino Saxon and it seems she was lost to follow-up she is due to repeat pelvic ultrasound and  get some labs drawn and referred to genetic counseling  Danelle Berry, PA-C 11/11/20 3:10 PM

## 2020-11-12 LAB — COMPLETE METABOLIC PANEL WITH GFR
AG Ratio: 1.5 (calc) (ref 1.0–2.5)
ALT: 20 U/L (ref 6–29)
AST: 17 U/L (ref 10–35)
Albumin: 4.2 g/dL (ref 3.6–5.1)
Alkaline phosphatase (APISO): 97 U/L (ref 37–153)
BUN: 10 mg/dL (ref 7–25)
CO2: 26 mmol/L (ref 20–32)
Calcium: 9.2 mg/dL (ref 8.6–10.4)
Chloride: 106 mmol/L (ref 98–110)
Creat: 0.57 mg/dL (ref 0.50–1.05)
GFR, Est African American: 125 mL/min/{1.73_m2} (ref >=60)
GFR, Est Non African American: 108 mL/min/{1.73_m2} (ref >=60)
Globulin: 2.8 g/dL (calc) (ref 1.9–3.7)
Glucose, Bld: 98 mg/dL (ref 65–99)
Potassium: 3.8 mmol/L (ref 3.5–5.3)
Sodium: 139 mmol/L (ref 135–146)
Total Bilirubin: 0.3 mg/dL (ref 0.2–1.2)
Total Protein: 7 g/dL (ref 6.1–8.1)

## 2020-11-12 LAB — LIPID PANEL
Cholesterol: 255 mg/dL — ABNORMAL HIGH (ref <200)
HDL: 44 mg/dL — ABNORMAL LOW (ref >=50)
Non-HDL Cholesterol (Calc): 211 mg/dL (calc) — ABNORMAL HIGH (ref <130)
Total CHOL/HDL Ratio: 5.8 (calc) — ABNORMAL HIGH (ref <5.0)
Triglycerides: 521 mg/dL — ABNORMAL HIGH (ref <150)

## 2020-11-12 LAB — CBC WITH DIFFERENTIAL/PLATELET
Absolute Monocytes: 689 cells/uL (ref 200–950)
Basophils Absolute: 45 cells/uL (ref 0–200)
Basophils Relative: 0.4 %
Eosinophils Absolute: 249 cells/uL (ref 15–500)
Eosinophils Relative: 2.2 %
HCT: 41.1 % (ref 35.0–45.0)
Hemoglobin: 13.4 g/dL (ref 11.7–15.5)
Lymphs Abs: 2249 cells/uL (ref 850–3900)
MCH: 28.2 pg (ref 27.0–33.0)
MCHC: 32.6 g/dL (ref 32.0–36.0)
MCV: 86.3 fL (ref 80.0–100.0)
MPV: 12.4 fL (ref 7.5–12.5)
Monocytes Relative: 6.1 %
Neutro Abs: 8068 cells/uL — ABNORMAL HIGH (ref 1500–7800)
Neutrophils Relative %: 71.4 %
Platelets: 240 10*3/uL (ref 140–400)
RBC: 4.76 10*6/uL (ref 3.80–5.10)
RDW: 12.3 % (ref 11.0–15.0)
Total Lymphocyte: 19.9 %
WBC: 11.3 10*3/uL — ABNORMAL HIGH (ref 3.8–10.8)

## 2020-11-12 LAB — TSH: TSH: 0.7 mIU/L

## 2020-11-15 NOTE — Addendum Note (Signed)
Addended by: Danelle Berry on: 11/15/2020 04:48 PM   Modules accepted: Orders

## 2020-11-17 ENCOUNTER — Telehealth: Payer: Self-pay

## 2020-11-17 NOTE — Telephone Encounter (Signed)
Copied from CRM [id number]. Topic: General - Other >> Nov 17, 2020  2:12 PM [name], Cassidy Lewis wrote: Reason for CRM: Pt requested Lewis call back from [place].

## 2020-11-18 ENCOUNTER — Telehealth: Payer: Self-pay | Admitting: Family Medicine

## 2020-11-18 ENCOUNTER — Telehealth: Payer: Self-pay

## 2020-11-18 NOTE — Telephone Encounter (Signed)
Pt is calling back for Marijean Niemann to ask her a question regarding her lab work.  Cb- [phone]

## 2020-11-18 NOTE — Telephone Encounter (Signed)
Spoke with patient and clarified what triglycerides were and reassured patient we would follow up and monitor her levels/medication. Patient was pleasant and in agreement, verbalized understanding and had no further questions at this time.  

## 2020-11-18 NOTE — Telephone Encounter (Signed)
Spoke with patient and clarified what triglycerides were and reassured patient we would follow up and monitor her levels/medication. Patient was pleasant and in agreement, verbalized understanding and had no further questions at this time.

## 2021-03-31 ENCOUNTER — Other Ambulatory Visit: Payer: Self-pay | Admitting: Family Medicine

## 2021-03-31 DIAGNOSIS — R519 Headache, unspecified: Secondary | ICD-10-CM

## 2021-06-02 ENCOUNTER — Ambulatory Visit: Payer: BC Managed Care – PPO | Admitting: Nurse Practitioner

## 2021-06-07 ENCOUNTER — Other Ambulatory Visit: Payer: Self-pay | Admitting: Family Medicine

## 2021-06-07 DIAGNOSIS — R519 Headache, unspecified: Secondary | ICD-10-CM

## 2021-06-07 DIAGNOSIS — R066 Hiccough: Secondary | ICD-10-CM

## 2021-06-07 NOTE — Telephone Encounter (Signed)
Requested medications are due for refill today.  yes  Requested medications are on the active medications list.  yes  Last refill. 11/11/2020  Future visit scheduled.   yes  Notes to clinic.  Medication not delegated.

## 2021-06-07 NOTE — Telephone Encounter (Signed)
Requested Prescriptions  Pending Prescriptions Disp Refills  . nortriptyline (PAMELOR) 10 MG capsule [Pharmacy Med Name: Nortriptyline HCl 10 MG Oral Capsule] 60 capsule 0    Sig: TAKE 1 CAPSULE BY MOUTH AT BEDTIME FOR CHRONIC DAILY HEADACHE     Psychiatry:  Antidepressants - Heterocyclics (TCAs) Failed - 06/07/2021  7:40 PM      Failed - Valid encounter within last 6 months    Recent Outpatient Visits          6 months ago Hiccups   [facility] Danelle Berry, PA-C   1 year ago Familial hypercholesterolemia   [facility] Danelle Berry, PA-C   1 year ago Adult general medical exam   [facility] Danelle Berry, PA-C   2 years ago Routine general medical examination at a health care facility   [facility] Lada, Janit Bern, MD   2 years ago Irritability   [facility] Lada, Janit Bern, MD      Future Appointments            In 2 weeks Zane Herald, Rudolpho Sevin, FNP [facility]           . baclofen (LIORESAL) 10 MG tablet [Pharmacy Med Name: Baclofen 10 MG Oral Tablet] 90 tablet 0    Sig: TAKE 1 TABLET BY MOUTH THREE TIMES DAILY AS NEEDED FOR  MUSCLE  TENSION,  HICCUPS     Not Delegated - Analgesics:  Muscle Relaxants Failed - 06/07/2021  7:40 PM      Failed - This refill cannot be delegated      Failed - Valid encounter within last 6 months    Recent Outpatient Visits          6 months ago Hiccups   [facility] Danelle Berry, PA-C   1 year ago Familial hypercholesterolemia   [facility] Danelle Berry, PA-C   1 year ago Adult general medical exam   [facility] Danelle Berry, PA-C   2 years ago Routine general medical examination at a health care facility   [facility] Lada, Janit Bern, MD   2 years ago Irritability   [facility] Lada, Janit Bern, MD      Future  Appointments            In 2 weeks Zane Herald, Rudolpho Sevin, FNP [facility]

## 2021-06-23 ENCOUNTER — Ambulatory Visit: Payer: BC Managed Care – PPO | Admitting: Nurse Practitioner

## 2021-06-23 ENCOUNTER — Other Ambulatory Visit: Payer: Self-pay

## 2021-06-23 ENCOUNTER — Encounter: Payer: Self-pay | Admitting: Nurse Practitioner

## 2021-06-23 VITALS — BP 124/82 | HR 87 | Temp 98.3°F | Resp 16 | Ht 68.0 in | Wt 159.2 lb

## 2021-06-23 DIAGNOSIS — R519 Headache, unspecified: Secondary | ICD-10-CM

## 2021-06-23 DIAGNOSIS — R066 Hiccough: Secondary | ICD-10-CM

## 2021-06-23 DIAGNOSIS — Z131 Encounter for screening for diabetes mellitus: Secondary | ICD-10-CM | POA: Diagnosis not present

## 2021-06-23 DIAGNOSIS — E781 Pure hyperglyceridemia: Secondary | ICD-10-CM | POA: Diagnosis not present

## 2021-06-23 DIAGNOSIS — Z79899 Other long term (current) drug therapy: Secondary | ICD-10-CM

## 2021-06-23 MED ORDER — BACLOFEN 10 MG PO TABS: ORAL_TABLET | ORAL | 3 refills | Status: DC

## 2021-06-23 MED ORDER — NORTRIPTYLINE HCL 10 MG PO CAPS: ORAL_CAPSULE | ORAL | 3 refills | Status: DC

## 2021-06-23 NOTE — Progress Notes (Signed)
BP 124/82   Pulse 87   Temp 98.3 F (36.8 C)   Resp 16   Ht 5\' 8"  (1.727 m)   Wt 159 lb 3.2 oz (72.2 kg)   SpO2 98%   BMI 24.21 kg/m    Subjective:    Patient ID: , female    DOB: 17-Oct-1970, 50 y.o.   MRN: [id number]  HPI: Cassidy Lewis is a 50 y.o. female, here alone  Chief Complaint  Patient presents with   Follow-up   Hyperlipidemia   Hyperlipidemia:  She is currently taking rosuvastatin 20 mg daily. She denies any myalgia.  Her last LDL was 100 on 11/11/20. Her triglycerides were 521.  She says she was not fasting but she is fasting today.  Will recheck labs. The 10-year ASCVD risk score (Arnett DK, et al., 2019) is: 2.3%   Values used to calculate the score:     Age: 70 years     Sex: Female     Is Non-Hispanic African American: No     Diabetic: No     Tobacco smoker: No     Systolic Blood Pressure: 124 mmHg     Is BP treated: No     HDL Cholesterol: 44 mg/dL     Total Cholesterol: 255 mg/dL   Muscle spasms/hiccups: She says she takes the baclofen for muscle spasms with really  bad hiccups. She says that it really helps.  Will give refill.   Headaches: She says that since she started taking nortriptyline 10 mg daily she has only had one headache.  She says that she can tell that it really helps. Will give refill.  Relevant past medical, surgical, family and social history reviewed and updated as indicated. Interim medical history since our last visit reviewed. Allergies and medications reviewed and updated.  Review of Systems  Constitutional: Negative for fever or weight change.  Respiratory: Negative for cough and shortness of breath.   Cardiovascular: Negative for chest pain or palpitations.  Gastrointestinal: Negative for abdominal pain, no bowel changes.  Musculoskeletal: Negative for gait problem or joint swelling.  Skin: Negative for rash.  Neurological: Negative for dizziness, positive for headache.  No other specific complaints in a  complete review of systems (except as listed in HPI above).      Objective:    BP 124/82   Pulse 87   Temp 98.3 F (36.8 C)   Resp 16   Ht 5\' 8"  (1.727 m)   Wt 159 lb 3.2 oz (72.2 kg)   SpO2 98%   BMI 24.21 kg/m   Wt Readings from Last 3 Encounters:  06/23/21 159 lb 3.2 oz (72.2 kg)  11/11/20 167 lb 12.8 oz (76.1 kg)  12/11/19 164 lb 4.8 oz (74.5 kg)    Physical Exam  Constitutional: Patient appears well-developed and well-nourished. No distress.  HEENT: head atraumatic, normocephalic, pupils equal and reactive to light, neck supple Cardiovascular: Normal rate, regular rhythm and normal heart sounds.  No murmur heard. No BLE edema. Pulmonary/Chest: Effort normal and breath sounds normal. No respiratory distress. Abdominal: Soft.  There is no tenderness. Psychiatric: Patient has a normal mood and affect. behavior is normal. Judgment and thought content normal.   Results for orders placed or performed in visit on 11/11/20  CBC with Differential/Platelet  Result Value Ref Range   WBC 11.3 (H) 3.8 - 10.8 Thousand/uL   RBC 4.76 3.80 - 5.10 Million/uL   Hemoglobin 13.4 11.7 - 15.5 g/dL   HCT 41.1  35.0 - 45.0 %   MCV 86.3 80.0 - 100.0 fL   MCH 28.2 27.0 - 33.0 pg   MCHC 32.6 32.0 - 36.0 g/dL   RDW 12.1 97.5 - 88.3 %   Platelets 240 140 - 400 Thousand/uL   MPV 12.4 7.5 - 12.5 fL   Neutro Abs 8,068 (H) 1,500 - 7,800 cells/uL   Lymphs Abs 2,249 850 - 3,900 cells/uL   Absolute Monocytes 689 200 - 950 cells/uL   Eosinophils Absolute 249 15 - 500 cells/uL   Basophils Absolute 45 0 - 200 cells/uL   Neutrophils Relative % 71.4 %   Total Lymphocyte 19.9 %   Monocytes Relative 6.1 %   Eosinophils Relative 2.2 %   Basophils Relative 0.4 %  Lipid panel  Result Value Ref Range   Cholesterol 255 (H) <200 mg/dL   HDL 44 (L) > OR = 50 mg/dL   Triglycerides 254 (H) <150 mg/dL   LDL Cholesterol (Calc)  mg/dL (calc)   Total CHOL/HDL Ratio 5.8 (H) <5.0 (calc)   Non-HDL Cholesterol  (Calc) 211 (H) <130 mg/dL (calc)  COMPLETE METABOLIC PANEL WITH GFR  Result Value Ref Range   Glucose, Bld 98 65 - 99 mg/dL   BUN 10 7 - 25 mg/dL   Creat 9.82 6.41 - 5.83 mg/dL   GFR, Est Non African American 108 > OR = 60 mL/min/1.32m2   GFR, Est African American 125 > OR = 60 mL/min/1.30m2   BUN/Creatinine Ratio NOT APPLICABLE 6 - 22 (calc)   Sodium 139 135 - 146 mmol/L   Potassium 3.8 3.5 - 5.3 mmol/L   Chloride 106 98 - 110 mmol/L   CO2 26 20 - 32 mmol/L   Calcium 9.2 8.6 - 10.4 mg/dL   Total Protein 7.0 6.1 - 8.1 g/dL   Albumin 4.2 3.6 - 5.1 g/dL   Globulin 2.8 1.9 - 3.7 g/dL (calc)   AG Ratio 1.5 1.0 - 2.5 (calc)   Total Bilirubin 0.3 0.2 - 1.2 mg/dL   Alkaline phosphatase (APISO) 97 37 - 153 U/L   AST 17 10 - 35 U/L   ALT 20 6 - 29 U/L  TSH  Result Value Ref Range   TSH 0.70 mIU/L      Assessment & Plan:   1. Hypertriglyceridemia -continue current treatment - Lipid panel  2. Hiccups -continue current treatment - baclofen (LIORESAL) 10 MG tablet; TAKE 1 TABLET BY MOUTH THREE TIMES DAILY AS NEEDED FOR  MUSCLE  TENSION,  HICCUPS  Dispense: 90 tablet; Refill: 3  3. Nonintractable headache, unspecified chronicity pattern, unspecified headache type -continue current treatment - nortriptyline (PAMELOR) 10 MG capsule; TAKE 1 CAPSULE BY MOUTH AT BEDTIME FOR CHRONIC DAILY HEADACHE  Dispense: 60 capsule; Refill: 3  4. Screening for diabetes mellitus  - COMPLETE METABOLIC PANEL WITH GFR - Hemoglobin A1c  5. Medication management  - Lipid panel - CBC with Differential/Platelet - COMPLETE METABOLIC PANEL WITH GFR   Follow up plan: Return in about 6 months (around 12/21/2021) for follow up.

## 2021-06-24 LAB — CBC WITH DIFFERENTIAL/PLATELET
Absolute Monocytes: 427 cells/uL (ref 200–950)
Basophils Absolute: 49 cells/uL (ref 0–200)
Basophils Relative: 0.7 %
Eosinophils Absolute: 161 cells/uL (ref 15–500)
Eosinophils Relative: 2.3 %
HCT: 40.6 % (ref 35.0–45.0)
Hemoglobin: 13.5 g/dL (ref 11.7–15.5)
Lymphs Abs: 1736 cells/uL (ref 850–3900)
MCH: 29 pg (ref 27.0–33.0)
MCHC: 33.3 g/dL (ref 32.0–36.0)
MCV: 87.3 fL (ref 80.0–100.0)
MPV: 12.4 fL (ref 7.5–12.5)
Monocytes Relative: 6.1 %
Neutro Abs: 4627 cells/uL (ref 1500–7800)
Neutrophils Relative %: 66.1 %
Platelets: 218 10*3/uL (ref 140–400)
RBC: 4.65 10*6/uL (ref 3.80–5.10)
RDW: 12.1 % (ref 11.0–15.0)
Total Lymphocyte: 24.8 %
WBC: 7 10*3/uL (ref 3.8–10.8)

## 2021-06-24 LAB — LIPID PANEL
Cholesterol: 172 mg/dL (ref <200)
HDL: 50 mg/dL (ref >=50)
LDL Cholesterol (Calc): 91 mg/dL (calc)
Non-HDL Cholesterol (Calc): 122 mg/dL (calc) (ref <130)
Total CHOL/HDL Ratio: 3.4 (calc) (ref <5.0)
Triglycerides: 214 mg/dL — ABNORMAL HIGH (ref <150)

## 2021-06-24 LAB — COMPLETE METABOLIC PANEL WITH GFR
AG Ratio: 1.4 (calc) (ref 1.0–2.5)
ALT: 25 U/L (ref 6–29)
AST: 21 U/L (ref 10–35)
Albumin: 4.2 g/dL (ref 3.6–5.1)
Alkaline phosphatase (APISO): 96 U/L (ref 37–153)
BUN: 10 mg/dL (ref 7–25)
CO2: 26 mmol/L (ref 20–32)
Calcium: 9.9 mg/dL (ref 8.6–10.4)
Chloride: 106 mmol/L (ref 98–110)
Creat: 0.75 mg/dL (ref 0.50–1.03)
Globulin: 2.9 g/dL (calc) (ref 1.9–3.7)
Glucose, Bld: 91 mg/dL (ref 65–99)
Potassium: 4.2 mmol/L (ref 3.5–5.3)
Sodium: 142 mmol/L (ref 135–146)
Total Bilirubin: 0.3 mg/dL (ref 0.2–1.2)
Total Protein: 7.1 g/dL (ref 6.1–8.1)
eGFR: 96 mL/min/{1.73_m2} (ref >=60)

## 2021-06-24 LAB — HEMOGLOBIN A1C
Hgb A1c MFr Bld: 5.6 % of total Hgb (ref <5.7)
Mean Plasma Glucose: 114 mg/dL
eAG (mmol/L): 6.3 mmol/L

## 2021-07-04 ENCOUNTER — Encounter: Payer: Self-pay | Admitting: Family Medicine

## 2021-07-04 ENCOUNTER — Encounter: Payer: Self-pay | Admitting: Nurse Practitioner

## 2021-07-04 NOTE — Telephone Encounter (Signed)
Pt needs appt to address this - migraines complex and previously managed by different provider - one ov more than 6 months ago - so pt should get f/up appt to address  Please call her to notify to schedule  Cannot do through msgs, need encounter please

## 2021-11-13 ENCOUNTER — Other Ambulatory Visit: Payer: Self-pay

## 2021-11-13 DIAGNOSIS — Z1231 Encounter for screening mammogram for malignant neoplasm of breast: Secondary | ICD-10-CM

## 2021-11-19 ENCOUNTER — Other Ambulatory Visit: Payer: Self-pay | Admitting: Family Medicine

## 2021-11-19 DIAGNOSIS — E7801 Familial hypercholesterolemia: Secondary | ICD-10-CM

## 2022-01-16 ENCOUNTER — Other Ambulatory Visit: Payer: Self-pay | Admitting: Family Medicine

## 2022-01-16 ENCOUNTER — Ambulatory Visit: Payer: BC Managed Care – PPO | Admitting: Podiatry

## 2022-01-16 ENCOUNTER — Encounter: Payer: Self-pay | Admitting: Podiatry

## 2022-01-16 ENCOUNTER — Ambulatory Visit (INDEPENDENT_AMBULATORY_CARE_PROVIDER_SITE_OTHER): Payer: BC Managed Care – PPO

## 2022-01-16 DIAGNOSIS — M722 Plantar fascial fibromatosis: Secondary | ICD-10-CM

## 2022-01-16 DIAGNOSIS — M7751 Other enthesopathy of right foot: Secondary | ICD-10-CM | POA: Diagnosis not present

## 2022-01-16 DIAGNOSIS — M7752 Other enthesopathy of left foot: Secondary | ICD-10-CM

## 2022-01-16 DIAGNOSIS — E7801 Familial hypercholesterolemia: Secondary | ICD-10-CM

## 2022-01-16 DIAGNOSIS — M779 Enthesopathy, unspecified: Secondary | ICD-10-CM

## 2022-01-16 MED ORDER — BETAMETHASONE SOD PHOS & ACET 6 (3-3) MG/ML IJ SUSP
3.0000 mg | Freq: Once | INTRAMUSCULAR | Status: AC
  Administered 2022-01-16: 3 mg via INTRA_ARTICULAR

## 2022-01-16 MED ORDER — MELOXICAM 15 MG PO TABS: 15.0000 mg | ORAL_TABLET | Freq: Every day | ORAL | 1 refills | Status: DC

## 2022-01-16 MED ORDER — METHYLPREDNISOLONE 4 MG PO TBPK: ORAL_TABLET | ORAL | 0 refills | Status: DC

## 2022-01-16 NOTE — Progress Notes (Signed)
   Subjective: 51 y.o. female presenting today for evaluation of right heel pain has been going on for about 5 months now.  She says that the left heel hurts as well but she believes it is because of compensation and relying on her left foot.  She works at the [employer] on her feet all day.  She has been experiencing significant pain and tenderness.  Most recently she went to the good feet store and spent over $1500 on arch supports.  She presents for further treatment and evaluation   Past Medical History:  Diagnosis Date   Endometriosis    Irritability 07/25/2018   Left flank pain 05/28/2017   Ovarian cyst    Pelvic fullness in female 03/03/2015   Vitamin B12 deficiency    Vitamin D deficiency disease    Past Surgical History:  Procedure Laterality Date   ABDOMINAL HYSTERECTOMY     partial   CESAREAN SECTION     x 3   DILATION AND CURETTAGE OF UTERUS     Allergies  Allergen Reactions   Coconut (Cocos Nucifera) Swelling   Penicillins Anaphylaxis   Macrobid [Nitrofurantoin]      Objective: Physical Exam General: The patient is alert and oriented x3 in no acute distress.  Dermatology: Skin is warm, dry and supple bilateral lower extremities. Negative for open lesions or macerations bilateral.   Vascular: Dorsalis Pedis and Posterior Tibial pulses palpable bilateral.  Capillary fill time is immediate to all digits.  Neurological: Epicritic and protective threshold intact bilateral.   Musculoskeletal: Tenderness to palpation to the plantar aspect of the right heel along the plantar fascia. All other joints range of motion within normal limits bilateral. Strength 5/5 in all groups bilateral.   Radiographic exam: Normal osseous mineralization. Joint spaces preserved. No fracture/dislocation/boney destruction. No other soft tissue abnormalities or radiopaque foreign bodies.   Assessment: 1. Plantar fasciitis right  Plan of Care:  1. Patient evaluated. Xrays  reviewed.   2. Injection of 0.5cc Celestone soluspan injected into the right plantar fascia  3. Rx for Medrol Dose Pack placed 4. Rx for Meloxicam ordered for patient. 5. Plantar fascial band(s) dispensed 6. Instructed patient regarding therapies and modalities at home to alleviate symptoms.  7.  Continue wearing OTC arch supports from the good feet store  8.  Return to clinic in 4 weeks.    *Works at a [employer] station on her feet during the entire shift  Felecia Shelling, DPM [facility]  Dr. Felecia Shelling, DPM    [address] [place], [place] [address]                Office [phone]  Fax [phone]

## 2022-01-18 ENCOUNTER — Ambulatory Visit: Payer: BC Managed Care – PPO | Admitting: Family Medicine

## 2022-01-19 ENCOUNTER — Encounter: Payer: Self-pay | Admitting: Family Medicine

## 2022-01-19 ENCOUNTER — Ambulatory Visit (INDEPENDENT_AMBULATORY_CARE_PROVIDER_SITE_OTHER): Payer: BC Managed Care – PPO | Admitting: Family Medicine

## 2022-01-19 VITALS — BP 124/82 | HR 99 | Temp 97.4°F | Resp 16 | Ht 68.0 in | Wt 158.2 lb

## 2022-01-19 DIAGNOSIS — R066 Hiccough: Secondary | ICD-10-CM

## 2022-01-19 DIAGNOSIS — E7801 Familial hypercholesterolemia: Secondary | ICD-10-CM | POA: Diagnosis not present

## 2022-01-19 DIAGNOSIS — Z8041 Family history of malignant neoplasm of ovary: Secondary | ICD-10-CM | POA: Diagnosis not present

## 2022-01-19 DIAGNOSIS — Z1159 Encounter for screening for other viral diseases: Secondary | ICD-10-CM

## 2022-01-19 DIAGNOSIS — Z90711 Acquired absence of uterus with remaining cervical stump: Secondary | ICD-10-CM

## 2022-01-19 DIAGNOSIS — E559 Vitamin D deficiency, unspecified: Secondary | ICD-10-CM | POA: Diagnosis not present

## 2022-01-19 DIAGNOSIS — R519 Headache, unspecified: Secondary | ICD-10-CM

## 2022-01-19 DIAGNOSIS — N83292 Other ovarian cyst, left side: Secondary | ICD-10-CM

## 2022-01-19 DIAGNOSIS — Z1211 Encounter for screening for malignant neoplasm of colon: Secondary | ICD-10-CM

## 2022-01-19 DIAGNOSIS — Z1231 Encounter for screening mammogram for malignant neoplasm of breast: Secondary | ICD-10-CM

## 2022-01-19 DIAGNOSIS — Z5181 Encounter for therapeutic drug level monitoring: Secondary | ICD-10-CM

## 2022-01-19 MED ORDER — NORTRIPTYLINE HCL 10 MG PO CAPS: ORAL_CAPSULE | ORAL | 3 refills | Status: DC

## 2022-01-19 MED ORDER — BACLOFEN 10 MG PO TABS: ORAL_TABLET | ORAL | 3 refills | Status: DC

## 2022-01-19 NOTE — Assessment & Plan Note (Signed)
Last f/up with GYN - Dr. Valentino Saxon, the f/up US showed improvement, she thought it would resolve on its own and no other f/up was recommended at that time

## 2022-01-19 NOTE — Assessment & Plan Note (Signed)
Cholesterol well controlled Good statin compliance

## 2022-01-19 NOTE — Assessment & Plan Note (Signed)
Encouraged pt to f/up with Dr. Valentino Saxon for the genetic referral and testing previously offered

## 2022-01-19 NOTE — Assessment & Plan Note (Signed)
Stable, prior labs in normal range, on supplements

## 2022-01-19 NOTE — Patient Instructions (Signed)
Call to make sure you schedule your mammogram before April Way overdue - but the order if good until April   [facility] [address], [place], [place] [address] Scheduling phone #: [phone]

## 2022-01-19 NOTE — Progress Notes (Signed)
Name: Cassidy Lewis   MRN: [id number]    DOB: 1970-10-06   Date:01/19/2022       Progress Note  Chief Complaint  Patient presents with   Follow-up   Hyperlipidemia     Subjective:   Cassidy Lewis is a 51 y.o. female, presents to clinic for routine f/up and med refills   Hyperlipidemia: Currently treated with crestor, pt reports good med compliance Last Lipids: Lab Results  Component Value Date   CHOL 172 06/23/2021   HDL 50 06/23/2021   LDLCALC 91 06/23/2021   TRIG 214 (H) 06/23/2021   CHOLHDL 3.4 06/23/2021   - Denies: Chest pain, shortness of breath, myalgias, claudication  Family hx of ovarian CA- reviewed prior notes/labs/imaging and family hx - discussed with pt   Hiccups - she states continued, but well controlled with 1 baclofen a day  Chronic HA's - meds daily have prevented them, never went to neuro f/up She denies OSA, says she sleep well, no snoring  Recent podiatry visit   Current Outpatient Medications:    Ascorbic Acid (VITAMIN C ADULT GUMMIES PO), Take by mouth daily., Disp: , Rfl:    baclofen (LIORESAL) 10 MG tablet, TAKE 1 TABLET BY MOUTH THREE TIMES DAILY AS NEEDED FOR  MUSCLE  TENSION,  HICCUPS, Disp: 90 tablet, Rfl: 3   EPINEPHrine 0.3 mg/0.3 mL IJ SOAJ injection, Inject 0.3 mLs (0.3 mg total) into the muscle as needed for anaphylaxis (for life-threatening allergic reaction)., Disp: 1 Device, Rfl: 1   meloxicam (MOBIC) 15 MG tablet, Take 1 tablet (15 mg total) by mouth daily., Disp: 30 tablet, Rfl: 1   methylPREDNISolone (MEDROL DOSEPAK) 4 MG TBPK tablet, 6 day dose pack - take as directed, Disp: 21 tablet, Rfl: 0   nortriptyline (PAMELOR) 10 MG capsule, TAKE 1 CAPSULE BY MOUTH AT BEDTIME FOR CHRONIC DAILY HEADACHE, Disp: 60 capsule, Rfl: 3   rosuvastatin (CRESTOR) 20 MG tablet, Take 1 tablet by mouth once daily, Disp: 90 tablet, Rfl: 1   vitamin B-12 (CYANOCOBALAMIN) 250 MCG tablet, Take 250 mcg by mouth daily., Disp: , Rfl:    VITAMIN E  PO, Take 500 mg by mouth daily., Disp: , Rfl:   Patient Active Problem List   Diagnosis Date Noted   Familial hypercholesterolemia 11/11/2020   Impingement syndrome, shoulder 07/20/2015   Perimenopausal vasomotor symptoms 06/10/2015   Complex cyst of left ovary 03/14/2015   Family history of ovarian cancer 03/03/2015   Vitamin D deficiency disease     Past Surgical History:  Procedure Laterality Date   ABDOMINAL HYSTERECTOMY     partial   CESAREAN SECTION     x 3   DILATION AND CURETTAGE OF UTERUS      Family History  Problem Relation Age of Onset   Cancer Mother        ovarian   Stroke Mother        possibly a small stroke   Depression Mother        Bipolar   Diabetes Father    Hyperlipidemia Father    Hypertension Father    Cancer Father        stomach cancer   Cancer Paternal Grandfather        lung cancer, died at age 74   Diabetes Maternal Grandmother    Gout Maternal Grandmother    Stroke Sister        fraction strokes   Endometriosis Sister    Migraines Sister    Heart failure  Paternal Grandmother    COPD Neg Hx    Heart disease Neg Hx     Social History   Tobacco Use   Smoking status: Never   Smokeless tobacco: Never  Vaping Use   Vaping Use: Never used  Substance Use Topics   Alcohol use: Yes    Comment: very rare   Drug use: No     Allergies  Allergen Reactions   Coconut (Cocos Nucifera) Swelling   Penicillins Anaphylaxis   Macrobid [Nitrofurantoin]     Health Maintenance  Topic Date Due   Hepatitis C Screening  Never done   Zoster Vaccines- Shingrix (1 of 2) Never done   COLONOSCOPY (Pts 45-41yrs Insurance coverage will need to be confirmed)  Never done   MAMMOGRAM  03/22/2016   COVID-19 Vaccine (1) 02/04/2022 (Originally 07/29/1971)   INFLUENZA VACCINE  03/06/2022   TETANUS/TDAP  10/01/2029   HIV Screening  Completed   HPV VACCINES  Aged Out   PAP SMEAR-Modifier  Discontinued    Chart Review Today: I personally reviewed  active problem list, medication list, allergies, family history, social history, health maintenance, notes from last encounter, lab results, imaging with the patient/caregiver today.   Review of Systems  Constitutional: Negative.   HENT: Negative.    Eyes: Negative.   Respiratory: Negative.    Cardiovascular: Negative.   Gastrointestinal: Negative.   Endocrine: Negative.   Genitourinary: Negative.   Musculoskeletal: Negative.   Skin: Negative.   Allergic/Immunologic: Negative.   Neurological: Negative.   Hematological: Negative.   Psychiatric/Behavioral: Negative.    All other systems reviewed and are negative.    Objective:   Vitals:   01/19/22 1355  BP: 124/82  Pulse: 99  Resp: 16  Temp: (!) 97.4 F (36.3 C)  TempSrc: Oral  SpO2: 99%  Weight: 158 lb 3.2 oz (71.8 kg)  Height: 5\' 8"  (1.727 m)    Body mass index is 24.05 kg/m.  Physical Exam Vitals and nursing note reviewed.  Constitutional:      General: She is not in acute distress.    Appearance: Normal appearance. She is well-developed. She is not ill-appearing, toxic-appearing or diaphoretic.     Interventions: Face mask in place.  HENT:     Head: Normocephalic and atraumatic.     Right Ear: External ear normal.     Left Ear: External ear normal.  Eyes:     General: Lids are normal. No scleral icterus.       Right eye: No discharge.        Left eye: No discharge.     Conjunctiva/sclera: Conjunctivae normal.  Neck:     Trachea: Phonation normal. No tracheal deviation.  Cardiovascular:     Rate and Rhythm: Normal rate and regular rhythm.     Pulses: Normal pulses.          Radial pulses are 2+ on the right side and 2+ on the left side.       Posterior tibial pulses are 2+ on the right side and 2+ on the left side.     Heart sounds: Normal heart sounds. No murmur heard.    No friction rub. No gallop.  Pulmonary:     Effort: Pulmonary effort is normal. No respiratory distress.     Breath sounds: Normal  breath sounds. No stridor. No wheezing, rhonchi or rales.  Chest:     Chest wall: No tenderness.  Abdominal:     General: Bowel sounds are normal. There is  no distension.     Palpations: Abdomen is soft.  Musculoskeletal:     Right lower leg: No edema.     Left lower leg: No edema.  Skin:    General: Skin is warm and dry.     Coloration: Skin is not jaundiced or pale.     Findings: No rash.  Neurological:     Mental Status: She is alert.     Motor: No abnormal muscle tone.     Gait: Gait normal.  Psychiatric:        Mood and Affect: Mood normal.        Speech: Speech normal.        Behavior: Behavior normal.         Assessment & Plan:   Problem List Items Addressed This Visit       Endocrine   Complex cyst of left ovary    Last f/up with GYN - Dr. Valentino Saxon, the f/up US showed improvement, she thought it would resolve on its own and no other f/up was recommended at that time      Relevant Orders   Ambulatory referral to Obstetrics / Gynecology     Other   Vitamin D deficiency disease - Primary    Stable, prior labs in normal range, on supplements      Relevant Orders   COMPLETE METABOLIC PANEL WITH GFR   Family history of ovarian cancer    Encouraged pt to f/up with Dr. Valentino Saxon for the genetic referral and testing previously offered      Relevant Orders   Ambulatory referral to Obstetrics / Gynecology   Familial hypercholesterolemia    Cholesterol well controlled Good statin compliance      Relevant Orders   COMPLETE METABOLIC PANEL WITH GFR   Lipid panel   Other Visit Diagnoses     Nonintractable headache, unspecified chronicity pattern, unspecified headache type       referred to neuro- Dr. Sherryll Burger, never went, nortriptyline has improved ha's significantly   Relevant Medications   nortriptyline (PAMELOR) 10 MG capsule   baclofen (LIORESAL) 10 MG tablet   S/P partial hysterectomy       still needs cervical ca screening - last reported done in 2014    Relevant Orders   Ambulatory referral to Obstetrics / Gynecology   Encounter for hepatitis C screening test for low risk patient       Relevant Orders   Hepatitis C antibody   Encounter for screening mammogram for malignant neoplasm of breast       ordered previously- pt needs to complete   Encounter for medication monitoring       Relevant Orders   CBC with Differential/Platelet   COMPLETE METABOLIC PANEL WITH GFR   Lipid panel   Hiccups       Baclofen effective but continued symptoms, did not try PPI, unclear etiology   Relevant Medications   baclofen (LIORESAL) 10 MG tablet   Screening for malignant neoplasm of colon       Relevant Orders   Fecal Globin By Immunochemistry         Prior pelvic pain, with family cancer hx/ovarian, personal hx of ovarian cysts - saw OBGYN int he past - Dr. Valentino Saxon  Last OV 2019: - Left ovarian with 2 cysts, ~` 2 cm each in size. Review of ultrasound from 2016 noted a 1.3 cm cyst. It is unclear if this is the same cyst from 2016 or if new cysts have developed.  Although cysts appear benign, due to patient's age and family history, I recommend performing a CA-125.  In addition, recommendations from radiologist include f/u ultrasound in 8-12 weeks, which would be approximately now, so will order ultrasound. Patient is no longer having pelvic pain after being diagnosed and treated for a UTI.  - Family h/o cancer (mom with h/o ovarian cancer now with recurrence, and dad with newly diagnosed stomach cancer).  Discussed hereditary cancer screening.  Patient notes when she tried to have it done a few years ago, her insurance would not pay for it. Discussed that now she has additional factors, as well as a new insurance carrier, and that she would more than likely qualify at this time.  Also discussed out of pocket cost for testing.  Given handouts on testing.  Patient notes that she will check with her new insurance company regarding the testing.  - Pelvic pain  has resolved with treatment of her UTI.   The repeated US was done but not the additional testing: Per Dr. Valentino Saxon: Please inform the patient that it appears that her cysts have become smaller since her last scan, and will likely continue to resolve on their own without any other necessary intervention.   Hx from 2016 Dr. Sherie DonBarrington Ellison hx of ovarian cancer and endometriosis; sister had hysterectomy for endometriosis; daughter has something similar to endometrosis; mother had hysterectomy and then got ovarian cancer; 2nd cousin died from ovarian cancer Last female exam and pap smear was two years ago  Overdue for pap  Discussed partial vs total hysterectomy with pt -no Pap completed or included in medical records or through care everywhere She will check her records at home and let us know if she can find the surgical details from her hysterectomy it happened about 20 years ago  Did recommend doing a Pap if she cannot find records -this can be done with GYN or done here  Health Maintenance  Topic Date Due   Hepatitis C Screening: USPSTF Recommendation to screen - Ages 45-79 yo.  Never done   Zoster (Shingles) Vaccine (1 of 2) Never done   Pap Smear  Never done   Colon Cancer Screening  Never done   Mammogram  03/22/2016   COVID-19 Vaccine (1) 02/04/2022*   Flu Shot  03/06/2022   Tetanus Vaccine  10/01/2029   HIV Screening  Completed   HPV Vaccine  Aged Out  *Topic was postponed. The date shown is not the original due date.     Return for complete labs by November.   Danelle Berry, PA-C 01/19/22 2:27 PM

## 2022-02-09 ENCOUNTER — Encounter: Payer: Self-pay | Admitting: Family Medicine

## 2022-02-09 ENCOUNTER — Telehealth: Payer: Self-pay | Admitting: Family Medicine

## 2022-02-09 NOTE — Telephone Encounter (Signed)
Pt has been informed we have not received anything at the moment. Pt stated she will take reach out to [place] through [employer] message or else she will bring it in.

## 2022-02-09 NOTE — Telephone Encounter (Signed)
Copied from CRM [id number]. Topic: General - Other >> Feb 09, 2022 12:02 PM [place] B wrote: Reason for OVZ:CHYIFOY called in to see if medical records were received from surgery she has at [facility]. She states was sent this morning. Please call back

## 2022-02-13 ENCOUNTER — Ambulatory Visit
Admission: RE | Admit: 2022-02-13 | Discharge: 2022-02-13 | Disposition: A | Discharge status: Home / Self Care | Payer: BC Managed Care – PPO | Source: Ambulatory Visit | Attending: Family Medicine | Admitting: Family Medicine

## 2022-02-13 DIAGNOSIS — Z1231 Encounter for screening mammogram for malignant neoplasm of breast: Secondary | ICD-10-CM | POA: Insufficient documentation

## 2022-02-13 NOTE — Telephone Encounter (Signed)
Pt came in today to verify her records were printed for you. Carollee Herter printed them through her mychart message that pt itself sent with her records. Pt would like to know if she does need a pap done or not on her future appointment. Papers are placed in your review pile.

## 2022-02-14 ENCOUNTER — Encounter: Payer: Self-pay | Admitting: Family Medicine

## 2022-02-14 NOTE — Telephone Encounter (Signed)
HM tab and surgical history has been updated. I have notified pt she does not need a pap smear since she has had a total abdominal hysterectomy. Pt verbalized understanding.

## 2022-02-15 ENCOUNTER — Encounter: Payer: Self-pay | Admitting: Family Medicine

## 2022-02-16 ENCOUNTER — Encounter: Payer: Self-pay | Admitting: Podiatry

## 2022-02-16 ENCOUNTER — Ambulatory Visit: Payer: BC Managed Care – PPO | Admitting: Podiatry

## 2022-02-16 DIAGNOSIS — M722 Plantar fascial fibromatosis: Secondary | ICD-10-CM | POA: Diagnosis not present

## 2022-02-16 NOTE — Progress Notes (Signed)
   Subjective: 51 y.o. female presenting today for follow-up evaluation of plantar fasciitis to the right foot this been going on for about 6 months now.  Patient states that she is doing much better.  She states that "she is almost 100%".  Overall significant improvement.  She presents for further treatment and evaluation  Past Medical History:  Diagnosis Date   Endometriosis    Irritability 07/25/2018   Left flank pain 05/28/2017   Ovarian cyst    Pelvic fullness in female 03/03/2015   Vitamin B12 deficiency    Vitamin D deficiency disease    Past Surgical History:  Procedure Laterality Date   ABDOMINAL HYSTERECTOMY     Total   CESAREAN SECTION     x 3   DILATION AND CURETTAGE OF UTERUS     Allergies  Allergen Reactions   Coconut (Cocos Nucifera) Swelling   Penicillins Anaphylaxis   Macrobid [Nitrofurantoin]      Objective: Physical Exam General: The patient is alert and oriented x3 in no acute distress.  Dermatology: Skin is warm, dry and supple bilateral lower extremities. Negative for open lesions or macerations bilateral.   Vascular: Dorsalis Pedis and Posterior Tibial pulses palpable bilateral.  Capillary fill time is immediate to all digits.  Neurological: Epicritic and protective threshold intact bilateral.   Musculoskeletal: Minimal tenderness to palpation to the plantar aspect of the right heel along the plantar fascia. All other joints range of motion within normal limits bilateral. Strength 5/5 in all groups bilateral.   Assessment: 1. Plantar fasciitis right  Plan of Care:  1. Patient evaluated.  2.  Overall the patient is doing significantly better.  Continue meloxicam 15 mg daily as needed 3.  Patient recently purchased new sneakers to wear during work.  She finds them very comfortable.  Continue.  Advised against going barefoot 4.  Continue daily stretching exercises 5.  A note was provided for the patient to take to the good feet store stating that  she is being actively treated for plantar fasciitis of the right heel and the OTC arch supports she purchased did not help alleviate pain.   6.  Return to clinic as needed  *Works at a [employer] station on her feet during the entire shift  Felecia Shelling, DPM [facility]  Dr. Felecia Shelling, DPM    [address] [place], [place] [address]                Office [phone]  Fax [phone]

## 2022-03-02 ENCOUNTER — Encounter: Payer: BC Managed Care – PPO | Admitting: Licensed Practical Nurse

## 2022-03-19 ENCOUNTER — Other Ambulatory Visit: Payer: Self-pay | Admitting: Family Medicine

## 2022-03-19 DIAGNOSIS — E7801 Familial hypercholesterolemia: Secondary | ICD-10-CM

## 2022-04-18 ENCOUNTER — Other Ambulatory Visit: Payer: Self-pay | Admitting: Nurse Practitioner

## 2022-04-18 DIAGNOSIS — R066 Hiccough: Secondary | ICD-10-CM

## 2022-04-18 DIAGNOSIS — R519 Headache, unspecified: Secondary | ICD-10-CM

## 2022-04-19 NOTE — Telephone Encounter (Signed)
Unable to refill per protocol, request is too soon for medications. Last refill for nortriptyline 01/19/22 for 90 days and 3 RF. Last refill for baclofen 01/19/22 for 30 and 3 RF. Will refuse request.  Requested Prescriptions  Pending Prescriptions Disp Refills  . nortriptyline (PAMELOR) 10 MG capsule [Pharmacy Med Name: Nortriptyline HCl 10 MG Oral Capsule] 60 capsule 0    Sig: TAKE 1 CAPSULE BY MOUTH AT BEDTIME FOR CHRONIC DAILY HEADACHE     Psychiatry:  Antidepressants - Heterocyclics (TCAs) Passed - 04/18/2022  9:07 AM      Passed - Valid encounter within last 6 months    Recent Outpatient Visits          3 months ago Vitamin D deficiency disease   [facility] Delsa Grana, PA-C   10 months ago Hypertriglyceridemia   [facility], FNP   1 year ago Hiccups   [facility] Delsa Grana, PA-C   2 years ago Familial hypercholesterolemia   [facility] Delsa Grana, PA-C   2 years ago Adult general medical exam   [facility] Delsa Grana, PA-C      Future Appointments            In 1 month Delsa Grana, PA-C [facility] [place]           . baclofen (LIORESAL) 10 MG tablet [Pharmacy Med Name: Baclofen 10 MG Oral Tablet] 90 tablet 0    Sig: TAKE 1 TABLET BY MOUTH THREE TIMES DAILY AS NEEDED FOR  MUSCLE  TENSION  OR  HICCUPS     Analgesics:  Muscle Relaxants - baclofen Failed - 04/18/2022  9:07 AM      Failed - Cr in normal range and within 180 days    Creat  Date Value Ref Range Status  06/23/2021 0.75 0.50 - 1.03 mg/dL Final         Failed - eGFR is 30 or above and within 180 days    GFR, Est African American  Date Value Ref Range Status  11/11/2020 125 > OR = 60 mL/min/1.43m Final   GFR, Est Non African American  Date Value Ref Range Status  11/11/2020 108 > OR = 60 mL/min/1.779mFinal   eGFR  Date Value Ref Range Status  06/23/2021 96 > OR  = 60 mL/min/1.739minal    Comment:    The eGFR is based on the CKD-EPI 2021 equation. To calculate  the new eGFR from a previous Creatinine or Cystatin C result, go to https://www.kidney.org/professionals/ kdoqi/gfr%5Fcalculator          Passed - Valid encounter within last 6 months    Recent Outpatient Visits          3 months [facility]   [facility]   [facility]   2 years [facility]   2 years [facility]      Future Appointments            In 1 month [facility]

## 2022-04-25 DIAGNOSIS — Z03818 Encounter for observation for suspected exposure to other biological agents ruled out: Secondary | ICD-10-CM | POA: Diagnosis not present

## 2022-04-25 DIAGNOSIS — J019 Acute sinusitis, unspecified: Secondary | ICD-10-CM | POA: Diagnosis not present

## 2022-04-25 DIAGNOSIS — J029 Acute pharyngitis, unspecified: Secondary | ICD-10-CM | POA: Diagnosis not present

## 2022-04-25 DIAGNOSIS — H66001 Acute suppurative otitis media without spontaneous rupture of ear drum, right ear: Secondary | ICD-10-CM | POA: Diagnosis not present

## 2022-05-17 ENCOUNTER — Telehealth: Payer: Self-pay | Admitting: Family Medicine

## 2022-05-17 NOTE — Telephone Encounter (Signed)
Copied from [place] [phone]. Topic: General - Other >> May 17, 2022  2:15 PM Cassidy Lewis wrote: Reason for CRM: The patient has called to request lab orders for their upcoming visit on 06/15/22  The patient would like to have their labs done prior to the visit  Please contact further when possible

## 2022-05-22 NOTE — Telephone Encounter (Signed)
No answer from pt left vm stating can come her labs done 2-3 prior appointment. Lab hours Mon-Fri 8am-11:30am or 1:30pm-3:30pm.

## 2022-05-25 DIAGNOSIS — Z1211 Encounter for screening for malignant neoplasm of colon: Secondary | ICD-10-CM | POA: Diagnosis not present

## 2022-05-25 DIAGNOSIS — E7801 Familial hypercholesterolemia: Secondary | ICD-10-CM | POA: Diagnosis not present

## 2022-05-25 DIAGNOSIS — Z5181 Encounter for therapeutic drug level monitoring: Secondary | ICD-10-CM | POA: Diagnosis not present

## 2022-05-25 DIAGNOSIS — E559 Vitamin D deficiency, unspecified: Secondary | ICD-10-CM | POA: Diagnosis not present

## 2022-05-25 DIAGNOSIS — Z1159 Encounter for screening for other viral diseases: Secondary | ICD-10-CM | POA: Diagnosis not present

## 2022-05-28 LAB — COMPLETE METABOLIC PANEL WITH GFR
AG Ratio: 1.6 (calc) (ref 1.0–2.5)
ALT: 23 U/L (ref 6–29)
AST: 21 U/L (ref 10–35)
Albumin: 4.2 g/dL (ref 3.6–5.1)
Alkaline phosphatase (APISO): 111 U/L (ref 37–153)
BUN: 14 mg/dL (ref 7–25)
CO2: 27 mmol/L (ref 20–32)
Calcium: 9.6 mg/dL (ref 8.6–10.4)
Chloride: 106 mmol/L (ref 98–110)
Creat: 0.66 mg/dL (ref 0.50–1.03)
Globulin: 2.7 g/dL (calc) (ref 1.9–3.7)
Glucose, Bld: 95 mg/dL (ref 65–99)
Potassium: 4.5 mmol/L (ref 3.5–5.3)
Sodium: 140 mmol/L (ref 135–146)
Total Bilirubin: 0.4 mg/dL (ref 0.2–1.2)
Total Protein: 6.9 g/dL (ref 6.1–8.1)
eGFR: 105 mL/min/{1.73_m2} (ref >=60)

## 2022-05-28 LAB — CBC WITH DIFFERENTIAL/PLATELET
Absolute Monocytes: 493 cells/uL (ref 200–950)
Basophils Absolute: 69 cells/uL (ref 0–200)
Basophils Relative: 0.9 %
Eosinophils Absolute: 246 cells/uL (ref 15–500)
Eosinophils Relative: 3.2 %
HCT: 39.6 % (ref 35.0–45.0)
Hemoglobin: 13.6 g/dL (ref 11.7–15.5)
Lymphs Abs: 1879 cells/uL (ref 850–3900)
MCH: 29.6 pg (ref 27.0–33.0)
MCHC: 34.3 g/dL (ref 32.0–36.0)
MCV: 86.3 fL (ref 80.0–100.0)
MPV: 12.6 fL — ABNORMAL HIGH (ref 7.5–12.5)
Monocytes Relative: 6.4 %
Neutro Abs: 5013 cells/uL (ref 1500–7800)
Neutrophils Relative %: 65.1 %
Platelets: 231 10*3/uL (ref 140–400)
RBC: 4.59 10*6/uL (ref 3.80–5.10)
RDW: 12 % (ref 11.0–15.0)
Total Lymphocyte: 24.4 %
WBC: 7.7 10*3/uL (ref 3.8–10.8)

## 2022-05-28 LAB — LIPID PANEL
Cholesterol: 199 mg/dL (ref <200)
HDL: 53 mg/dL (ref >=50)
LDL Cholesterol (Calc): 110 mg/dL (calc) — ABNORMAL HIGH
Non-HDL Cholesterol (Calc): 146 mg/dL (calc) — ABNORMAL HIGH (ref <130)
Total CHOL/HDL Ratio: 3.8 (calc) (ref <5.0)
Triglycerides: 239 mg/dL — ABNORMAL HIGH (ref <150)

## 2022-05-28 LAB — HEPATITIS C ANTIBODY: Hepatitis C Ab: NONREACTIVE

## 2022-05-29 LAB — FECAL GLOBIN BY IMMUNOCHEMISTRY
FECAL GLOBIN RESULT:: NOT DETECTED
MICRO NUMBER:: 14086773
SPECIMEN QUALITY:: ADEQUATE

## 2022-06-05 ENCOUNTER — Other Ambulatory Visit: Payer: Self-pay | Admitting: Podiatry

## 2022-06-15 ENCOUNTER — Encounter: Payer: Self-pay | Admitting: Family Medicine

## 2022-06-15 ENCOUNTER — Ambulatory Visit: Payer: BC Managed Care – PPO | Admitting: Family Medicine

## 2022-06-15 VITALS — BP 132/78 | HR 93 | Temp 98.1°F | Resp 16 | Ht 68.0 in | Wt 164.1 lb

## 2022-06-15 DIAGNOSIS — Z23 Encounter for immunization: Secondary | ICD-10-CM | POA: Diagnosis not present

## 2022-06-15 DIAGNOSIS — Z1211 Encounter for screening for malignant neoplasm of colon: Secondary | ICD-10-CM

## 2022-06-15 DIAGNOSIS — Z0000 Encounter for general adult medical examination without abnormal findings: Secondary | ICD-10-CM | POA: Diagnosis not present

## 2022-06-15 DIAGNOSIS — E7801 Familial hypercholesterolemia: Secondary | ICD-10-CM

## 2022-06-15 MED ORDER — ROSUVASTATIN CALCIUM 20 MG PO TABS: 20.0000 mg | ORAL_TABLET | Freq: Every day | ORAL | 3 refills | Status: DC

## 2022-06-15 NOTE — Patient Instructions (Addendum)
[email or web address]  Preventive Care 11-52 Years Old, Female Preventive care refers to lifestyle choices and visits with your health care provider that can promote health and wellness. Preventive care visits are also called wellness exams. What can I expect for my preventive care visit? Counseling Your health care provider may ask you questions about your: Medical history, including: Past medical problems. Family medical history. Pregnancy history. Current health, including: Menstrual cycle. Method of birth control. Emotional well-being. Home life and relationship well-being. Sexual activity and sexual health. Lifestyle, including: Alcohol, nicotine or tobacco, and drug use. Access to firearms. Diet, exercise, and sleep habits. Work and work Statistician. Sunscreen use. Safety issues such as seatbelt and bike helmet use. Physical exam Your health care provider will check your: Height and weight. These may be used to calculate your BMI (body mass index). BMI is a measurement that tells if you are at a healthy weight. Waist circumference. This measures the distance around your waistline. This measurement also tells if you are at a healthy weight and may help predict your risk of certain diseases, such as type 2 diabetes and high blood pressure. Heart rate and blood pressure. Body temperature. Skin for abnormal spots. What immunizations do I need?  Vaccines are usually given at various ages, according to a schedule. Your health care provider will recommend vaccines for you based on your age, medical history, and lifestyle or other factors, such as travel or where you work. What tests do I need? Screening Your health care provider may recommend screening tests for certain conditions. This may include: Lipid and cholesterol levels. Diabetes screening. This is done by checking your blood sugar (glucose) after you have not eaten for a  while (fasting). Pelvic exam and Pap test. Hepatitis B test. Hepatitis C test. HIV (human immunodeficiency virus) test. STI (sexually transmitted infection) testing, if you are at risk. Lung cancer screening. Colorectal cancer screening. Mammogram. Talk with your health care provider about when you should start having regular mammograms. This may depend on whether you have a family history of breast cancer. BRCA-related cancer screening. This may be done if you have a family history of breast, ovarian, tubal, or peritoneal cancers. Bone density scan. This is done to screen for osteoporosis. Talk with your health care provider about your test results, treatment options, and if necessary, the need for more tests. Follow these instructions at home: Eating and drinking  Eat a diet that includes fresh fruits and vegetables, whole grains, lean protein, and low-fat dairy products. Take vitamin and mineral supplements as recommended by your health care provider. Do not drink alcohol if: Your health care provider tells you not to drink. You are pregnant, may be pregnant, or are planning to become pregnant. If you drink alcohol: Limit how much you have to 0-1 drink a day. Know how much alcohol is in your drink. In the [place] one drink equals one 12 oz bottle of beer (355 mL), one 5 oz glass of wine (148 mL), or one 1 oz glass of hard liquor (44 mL). Lifestyle Brush your teeth every morning and night with fluoride toothpaste. Floss one time each day. Exercise for at least 30 minutes 5 or more days each week. Do not use any products that contain nicotine or tobacco. These products include cigarettes, chewing tobacco, and vaping devices, such as e-cigarettes. If you need help quitting, ask your health care provider. Do not use drugs. If you are sexually active, practice safe sex. Use a condom or other form of  protection to prevent STIs. If you do not wish to become pregnant, use a form of birth  control. If you plan to become pregnant, see your health care provider for a prepregnancy visit. Take aspirin only as told by your health care provider. Make sure that you understand how much to take and what form to take. Work with your health care provider to find out whether it is safe and beneficial for you to take aspirin daily. Find healthy ways to manage stress, such as: Meditation, yoga, or listening to music. Journaling. Talking to a trusted person. Spending time with friends and family. Minimize exposure to UV radiation to reduce your risk of skin cancer. Safety Always wear your seat belt while driving or riding in a vehicle. Do not drive: If you have been drinking alcohol. Do not ride with someone who has been drinking. When you are tired or distracted. While texting. If you have been using any mind-altering substances or drugs. Wear a helmet and other protective equipment during sports activities. If you have firearms in your house, make sure you follow all gun safety procedures. Seek help if you have been physically or sexually abused. What's next? Visit your health care provider once a year for an annual wellness visit. Ask your health care provider how often you should have your eyes and teeth checked. Stay up to date on all vaccines. This information is not intended to replace advice given to you by your health care provider. Make sure you discuss any questions you have with your health care provider. Document Revised: 01/18/2021 Document Reviewed: 01/18/2021 Elsevier Patient Education  [place].

## 2022-06-15 NOTE — Progress Notes (Signed)
Patient: Cassidy Lewis, Female    DOB: 04/04/1971, 51 y.o.   MRN: [id number] Delsa Grana, PA-C Visit Date: 06/15/2022  Today's Provider: Delsa Grana, PA-C   Chief Complaint  Patient presents with   Follow-up   Hyperlipidemia   Subjective:   Annual physical exam:  Cassidy Lewis is a 51 y.o. female who presents today for complete physical exam:  Exercise/Activity:  walking 2-3 miles when she works and additional mile a day  Diet/nutrition:  watch what she eats, tries to eat healthy, cut back on soda increase water Sleep:  2x sleep disturbance due to hot flashes   [place]: No Food Insecurity (07/25/2018)  Transportation Needs: No Transportation Needs (07/25/2018)  Alcohol Screen: Low Risk  (12/11/2019)  Depression (PHQ2-9): Low Risk  (06/15/2022)  Financial Resource Strain: Low Risk  (09/05/2018)  Recent Concern: Financial Resource Strain - Medium Risk (07/25/2018)  Physical Activity: Inactive (07/25/2018)  Social Connections: Unknown (09/05/2018)  Stress: No Stress Concern Present (09/05/2018)  Recent Concern: Stress - Stress Concern Present (07/25/2018)  Tobacco Use: Low Risk  (06/15/2022)    Cholesterol labs reviewed and meds good compliance and no concerns  HA - still needing nortriptyline daily to prevent  USPSTF grade A and B recommendations - reviewed and addressed today  Depression:  Phq 9 completed today by patient, was reviewed by me with patient in the room PHQ score is neg, pt feels good    06/15/2022   10:55 AM 01/19/2022    1:52 PM 06/23/2021    8:29 AM 11/11/2020    3:03 PM  PHQ 2/9 Scores  PHQ - 2 Score 0 0 0 0  PHQ- 9 Score 0 0 0 0      06/15/2022   10:55 AM 01/19/2022    1:52 PM 06/23/2021    8:29 AM 11/11/2020    3:03 PM 12/11/2019    8:54 AM  Depression screen PHQ 2/9  Decreased Interest 0 0 0 0 0  Down, Depressed, Hopeless 0 0 0 0 0  PHQ - 2 Score 0 0 0 0 0  Altered sleeping 0 0 0 0 0  Tired, decreased  energy 0 0 0 0 0  Change in appetite 0 0 0 0 0  Feeling bad or failure about yourself  0 0 0 0 0  Trouble concentrating 0 0 0 0 0  Moving slowly or fidgety/restless 0 0 0 0 0  Suicidal thoughts 0 0 0 0 0  PHQ-9 Score 0 0 0 0 0  Difficult doing work/chores Not difficult at all Not difficult at all Not difficult at all Not difficult at all Not difficult at all    Alcohol screening: [place] Office Visit from 06/15/2022 in [facility]  AUDIT-C Score 1       Immunizations and Health Maintenance: Health Maintenance  Topic Date Due   COVID-19 Vaccine (1) 07/01/2022 (Originally 01/28/1976)   COLONOSCOPY (Pts 45-66yr Insurance coverage will need to be confirmed)  06/16/2023 (Originally 01/28/2015)   MAMMOGRAM  02/14/2023   TETANUS/TDAP  10/01/2029   Hepatitis C Screening  Completed   HIV Screening  Completed   HPV VACCINES  Aged Out   INFLUENZA VACCINE  Discontinued   Zoster Vaccines- Shingrix  Discontinued     Hep C Screening: done  STD testing and prevention (HIV/chl/gon/syphilis):  see above, no additional testing desired by pt today  Intimate partner violence:denies  Sexual History/Pain during Intercourse: Married  - no  AUB, no pain  Menstrual History/LMP/Abnormal Bleeding: no AUB No LMP recorded. Patient has had a hysterectomy.  Incontinence Symptoms: none  Breast cancer: recently done and reviewed Last Mammogram: *see HM list above BRCA gene screening: none  Osteoporosis:   not supplementing - doing lots of walking Discussion on osteoporosis per age, including high calcium and vitamin D supplementation, weight bearing exercises Pt is not supplementing with daily calcium/Vit D. One a day 35 and over has 1000 IU vit D and 300 mg calcium  Skin cancer:  Hx of skin CA -  NO  Discussed atypical lesions   Colorectal cancer:   Colonoscopy is did FIT test - negative    Discussed concerning signs and sx of CRC, pt denies change in bowels, blood in  stool, abdominal pain or unintentional weight loss, no melena hematochezia  Lung cancer:   Low Dose CT Chest recommended if Age 66-80 years, 20 pack-year currently smoking OR have quit w/in 15years. Patient does not qualify.    Social History   Tobacco Use   Smoking status: Never   Smokeless tobacco: Never  Vaping Use   Vaping Use: Never used  Substance Use Topics   Alcohol use: Yes    Comment: very rare   Drug use: No     Flowsheet Row Office Visit from 12/11/[facility] in [facility]  AUDIT-C Score 0       Family History  Problem Relation Age of Onset   Cancer Mother        ovarian   Stroke Mother        possibly a small stroke   Depression Mother        Bipolar   Diabetes Father    Hyperlipidemia Father    Hypertension Father    Cancer Father        stomach cancer   Cancer Paternal Grandfather        lung cancer, died at age 55   Diabetes Maternal Grandmother    Gout Maternal Grandmother    Stroke Sister        fraction strokes   Endometriosis Sister    Migraines Sister    Heart failure Paternal Grandmother    COPD Neg Hx    Heart disease Neg Hx      Blood pressure/Hypertension: BP Readings from Last 3 Encounters:  06/15/22 132/78  01/19/22 124/82  06/23/21 124/82    Weight/Obesity: Wt Readings from Last 3 Encounters:  06/15/22 164 lb 1.6 oz (74.4 kg)  01/19/22 158 lb 3.2 oz (71.8 kg)  06/23/21 159 lb 3.2 oz (72.2 kg)   BMI Readings from Last 3 Encounters:  06/15/22 24.95 kg/m  01/19/22 24.05 kg/m  06/23/21 24.21 kg/m     Lipids:  Lab Results  Component Value Date   CHOL 199 05/25/2022   CHOL 172 06/23/2021   CHOL 255 (H) 11/11/2020   Lab Results  Component Value Date   HDL 53 05/25/2022   HDL 50 06/23/2021   HDL 44 (L) 11/11/2020   Lab Results  Component Value Date   LDLCALC 110 (H) 05/25/2022   [place] 91 06/23/2021   [place]  11/11/2020     Comment:     . LDL cholesterol not calculated. Triglyceride  levels greater than 400 mg/dL invalidate calculated LDL results. . Reference range: <100 . Desirable range <100 mg/dL for primary prevention;   <70 mg/dL for patients with CHD or diabetic patients  with > or = 2 CHD risk factors. Marland Kitchen  LDL-C is now calculated using the Martin-Hopkins  calculation, which is a validated novel method providing  better accuracy than the Friedewald equation in the  estimation of LDL-C.  Cresenciano Genre et al. Annamaria Helling. 3235;573(22): 2061-2068  (http://education.[facility].com/faq/FAQ164)    Lab Results  Component Value Date   TRIG 239 (H) 05/25/2022   TRIG 214 (H) 06/23/2021   TRIG 521 (H) 11/11/2020   Lab Results  Component Value Date   CHOLHDL 3.8 05/25/2022   CHOLHDL 3.4 06/23/2021   CHOLHDL 5.8 (H) 11/11/2020   No results found for: "LDLDIRECT" Based on the results of lipid panel his/her cardiovascular risk factor ( using [facility] )  in the next 10 years is: The 10-year ASCVD risk score (Arnett DK, et al., 2019) is: 1.6%   Values used to calculate the score:     Age: 16 years     Sex: Female     Is Non-Hispanic African American: No     Diabetic: No     Tobacco smoker: No     Systolic Blood Pressure: 025 mmHg     Is BP treated: No     HDL Cholesterol: 53 mg/dL     Total Cholesterol: 199 mg/dL  Glucose:  Glucose, Bld  Date Value Ref Range Status  05/25/2022 95 65 - 99 mg/dL Final    Comment:    .            Fasting reference interval .   06/23/2021 91 65 - 99 mg/dL Final    Comment:    .            Fasting reference interval .   11/11/2020 98 65 - 99 mg/dL Final    Comment:    .            Fasting reference interval .     Advanced Care Planning:  A voluntary discussion about advance care planning including the explanation and discussion of advance directives.   Discussed health care proxy and Living will, and the patient was able to identify a health care proxy as husband.   Patient does not have a living will at present  time.   Social History       Social History   Socioeconomic History   Marital status: Married    Spouse name: Ronnie   Number of children: 3   Years of education: Not on file   Highest education level: GED or equivalent  Occupational History   Not on file  Tobacco Use   Smoking status: Never   Smokeless tobacco: Never  Vaping Use   Vaping Use: Never used  Substance and Sexual Activity   Alcohol use: Yes    Comment: very rare   Drug use: No   Sexual activity: Yes    Partners: Male    Birth control/protection: None, Surgical  Other Topics Concern   Not on file  Social History Narrative   Not on file   Social Determinants of Health   Financial Resource Strain: Low Risk  (09/05/2018)   Overall Financial Resource Strain (CARDIA)    Difficulty of Paying Living Expenses: Not hard at all  Recent Concern: Financial Resource Strain - Medium Risk (07/25/2018)   Overall Financial Resource Strain (CARDIA)    Difficulty of Paying Living Expenses: Somewhat hard  Food Insecurity: No Food Insecurity (07/25/2018)   Hunger Vital Sign    Worried About Running Out of Food in the Last Year: Never true    Ran Out  of Food in the Last Year: Never true  Transportation Needs: No Transportation Needs (07/25/2018)   PRAPARE - Hydrologist (Medical): No    Lack of Transportation (Non-Medical): No  Physical Activity: Inactive (07/25/2018)   Exercise Vital Sign    Days of Exercise per Week: 0 days    Minutes of Exercise per Session: 0 min  Stress: No Stress Concern Present (09/05/2018)   [place]    Feeling of Stress : Not at all  Recent Concern: Stress - Stress Concern Present (07/25/2018)   [place]    Feeling of Stress : Very much  Social Connections: Unknown (09/05/2018)   Social Connection and Isolation Panel [NHANES]     Frequency of Communication with Friends and Family: Not on file    Frequency of Social Gatherings with Friends and Family: Not on file    Attends Religious Services: Never    Marine scientist or Organizations: Not on file    Attends Archivist Meetings: Not on file    Marital Status: Not on file    Family History        Family History  Problem Relation Age of Onset   Cancer Mother        ovarian   Stroke Mother        possibly a small stroke   Depression Mother        Bipolar   Diabetes Father    Hyperlipidemia Father    Hypertension Father    Cancer Father        stomach cancer   Cancer Paternal Grandfather        lung cancer, died at age 74   Diabetes Maternal Grandmother    Gout Maternal Grandmother    Stroke Sister        fraction strokes   Endometriosis Sister    Migraines Sister    Heart failure Paternal Grandmother    COPD Neg Hx    Heart disease Neg Hx     Patient Active Problem List   Diagnosis Date Noted   Familial hypercholesterolemia 11/11/2020   Complex cyst of left ovary 03/14/2015   Family history of ovarian cancer 03/03/2015   Vitamin D deficiency disease     Past Surgical History:  Procedure Laterality Date   ABDOMINAL HYSTERECTOMY     Total   CESAREAN SECTION     x 3   DILATION AND CURETTAGE OF UTERUS       Current Outpatient Medications:    Ascorbic Acid (VITAMIN C ADULT GUMMIES PO), Take by mouth daily., Disp: , Rfl:    baclofen (LIORESAL) 10 MG tablet, TAKE 1 TABLET BY MOUTH THREE TIMES DAILY AS NEEDED FOR  MUSCLE  TENSION,  HICCUPS, Disp: 90 tablet, Rfl: 3   EPINEPHrine 0.3 mg/0.3 mL IJ SOAJ injection, Inject 0.3 mLs (0.3 mg total) into the muscle as needed for anaphylaxis (for life-threatening allergic reaction)., Disp: 1 Device, Rfl: 1   meloxicam (MOBIC) 15 MG tablet, Take 1 tablet by mouth once daily, Disp: 30 tablet, Rfl: 0   nortriptyline (PAMELOR) 10 MG capsule, TAKE 1 CAPSULE BY MOUTH AT BEDTIME FOR CHRONIC DAILY  HEADACHE, Disp: 90 capsule, Rfl: 3   rosuvastatin (CRESTOR) 20 MG tablet, Take 1 tablet by mouth once daily, Disp: 90 tablet, Rfl: 0   vitamin B-12 (CYANOCOBALAMIN) 250 MCG tablet, Take 250 mcg by mouth  daily., Disp: , Rfl:    methylPREDNISolone (MEDROL DOSEPAK) 4 MG TBPK tablet, 6 day dose pack - take as directed (Patient not taking: Reported on 06/15/2022), Disp: 21 tablet, Rfl: 0   VITAMIN E PO, Take 500 mg by mouth daily. (Patient not taking: Reported on 06/15/2022), Disp: , Rfl:   Allergies  Allergen Reactions   Coconut (Cocos Nucifera) Swelling   Penicillins Anaphylaxis   Macrobid [Nitrofurantoin]     Patient Care Team: Delsa Grana, PA-C as PCP - General (Family Medicine)   Chart Review: I personally reviewed active problem list, medication list, allergies, family history, social history, health maintenance, notes from last encounter, lab results, imaging with the patient/caregiver today.   Review of Systems  Constitutional: Negative.   HENT: Negative.    Eyes: Negative.   Respiratory: Negative.    Cardiovascular: Negative.   Gastrointestinal: Negative.   Endocrine: Negative.   Genitourinary: Negative.   Musculoskeletal: Negative.   Skin: Negative.   Allergic/Immunologic: Negative.   Neurological: Negative.   Hematological: Negative.   Psychiatric/Behavioral: Negative.    All other systems reviewed and are negative.         Objective:   Vitals:  Vitals:   06/15/22 1102  BP: 132/78  Pulse: 93  Resp: 16  Temp: 98.1 F (36.7 C)  TempSrc: Oral  SpO2: 99%  Weight: 164 lb 1.6 oz (74.4 kg)  Height: _0  (1.727 m)    Body mass index is 24.95 kg/m.  Physical Exam Vitals and nursing note reviewed.  Constitutional:      General: She is not in acute distress.    Appearance: Normal appearance. She is well-developed, well-groomed and normal weight. She is not ill-appearing, toxic-appearing or diaphoretic.  HENT:     Head: Normocephalic and atraumatic.      Right Ear: Tympanic membrane, ear canal and external ear normal. There is no impacted cerumen.     Left Ear: Tympanic membrane, ear canal and external ear normal. There is no impacted cerumen.     Nose: No congestion or rhinorrhea.     Mouth/Throat:     Mouth: Mucous membranes are moist.     Pharynx: Oropharynx is clear. No oropharyngeal exudate or posterior oropharyngeal erythema.  Eyes:     General: Lids are normal. No scleral icterus.       Right eye: No discharge.        Left eye: No discharge.     Conjunctiva/sclera: Conjunctivae normal.  Neck:     Trachea: Phonation normal. No tracheal deviation.  Cardiovascular:     Rate and Rhythm: Normal rate and regular rhythm.     Pulses: Normal pulses.          Radial pulses are 2+ on the right side and 2+ on the left side.       Posterior tibial pulses are 2+ on the right side and 2+ on the left side.     Heart sounds: Normal heart sounds. No murmur heard.    No friction rub. No gallop.  Pulmonary:     Effort: Pulmonary effort is normal. No respiratory distress.     Breath sounds: Normal breath sounds. No stridor. No wheezing, rhonchi or rales.  Chest:     Chest wall: No tenderness.  Abdominal:     General: Bowel sounds are normal. There is no distension.     Palpations: Abdomen is soft.  Musculoskeletal:     Right lower leg: No edema.     Left lower  leg: No edema.  Skin:    General: Skin is warm and dry.     Coloration: Skin is not jaundiced or pale.     Findings: No rash.  Neurological:     Mental Status: She is alert.     Motor: No abnormal muscle tone.     Gait: Gait normal.  Psychiatric:        Mood and Affect: Mood normal.        Speech: Speech normal.        Behavior: Behavior normal. Behavior is cooperative.       Fall Risk:    06/15/2022   10:55 AM 01/19/2022    1:52 PM 06/23/2021    8:29 AM 11/11/2020    3:03 PM 12/11/2019    8:54 AM  Fall Risk   Falls in the past year? 0 0 0 0 0  Number falls in past yr: 0  0 0 0 0  Injury with Fall? 0 0 0 0 0  Risk for fall due to : No Fall Risks No Fall Risks     Follow up Falls prevention discussed;Education provided;Falls evaluation completed Falls prevention discussed;Education provided       Functional Status Survey: Is the patient deaf or have difficulty hearing?: No Does the patient have difficulty seeing, even when wearing glasses/contacts?: No Does the patient have difficulty concentrating, remembering, or making decisions?: No Does the patient have difficulty walking or climbing stairs?: Yes Does the patient have difficulty dressing or bathing?: No Does the patient have difficulty doing errands alone such as visiting a doctor's office or shopping?: No   Assessment & Plan:    CPE completed today  USPSTF grade A and B recommendations reviewed with patient; age-appropriate recommendations, preventive care, screening tests, etc discussed and encouraged; healthy living encouraged; see AVS for patient education given to patient  Discussed importance of 150 minutes of physical activity weekly, AHA exercise recommendations given to pt in AVS/handout  Discussed importance of healthy diet:  eating lean meats and proteins, avoiding trans fats and saturated fats, avoid simple sugars and excessive carbs in diet, eat 6 servings of fruit/vegetables daily and drink plenty of water and avoid sweet beverages.    Recommended pt to do annual eye exam and routine dental exams/cleanings  Depression, alcohol, fall screening completed as documented above and per flowsheets  Advance Care planning information and packet discussed and offered today, encouraged pt to discuss with family members/spouse/partner/friends and complete Advanced directive packet and bring copy to office   Reviewed Health Maintenance: Health Maintenance  Topic Date Due   COVID-19 Vaccine (1) 07/01/2022 (Originally 01/28/1976)   COLONOSCOPY (Pts 45-27yr Insurance coverage will need to be  confirmed)  06/16/2023 (Originally 01/28/2015)   MAMMOGRAM  02/14/2023   TETANUS/TDAP  10/01/2029   Hepatitis C Screening  Completed   HIV Screening  Completed   HPV VACCINES  Aged Out   INFLUENZA VACCINE  Discontinued   Zoster Vaccines- Shingrix  Discontinued    Immunizations: Immunization History  Administered Date(s) Administered   Tdap 08/07/2007, 10/02/2019   Vaccines:  HPV: up to at age 52, ask insurance if age between 213-45 Shingrix: 568-64yo and ask insurance if covered when patient above 623yo Pneumonia:  educated and discussed with patient. Flu: refused -  educated and discussed with patient. COVID:      ICD-10-CM   1. Adult general medical exam  Z00.00    labs done previously and reviewed today  2. Familial hypercholesterolemia  E78.01 rosuvastatin (CRESTOR) 20 MG tablet   LDL was 190, last labs around 100, still a good % reduction, continue with crestor 20    3. Need for influenza vaccination  Z23    refused          Delsa Grana, PA-C 06/15/22 11:16 AM  [facility]

## 2022-06-17 ENCOUNTER — Other Ambulatory Visit: Payer: Self-pay | Admitting: Nurse Practitioner

## 2022-06-17 DIAGNOSIS — R519 Headache, unspecified: Secondary | ICD-10-CM

## 2022-06-18 NOTE — Telephone Encounter (Signed)
Requested Prescriptions  Pending Prescriptions Disp Refills   nortriptyline (PAMELOR) 10 MG capsule [Pharmacy Med Name: Nortriptyline HCl 10 MG Oral Capsule] 90 capsule 0    Sig: TAKE 1 CAPSULE BY MOUTH AT BEDTIME FOR CHRONIC DAILY HEADACHE     Psychiatry:  Antidepressants - Heterocyclics (TCAs) Passed - 06/17/2022 10:21 AM      Passed - Valid encounter within last 6 months    Recent Outpatient Visits           3 days ago Adult general medical exam   [facility] Danelle Berry, PA-C   5 months ago Vitamin D deficiency disease   [facility] Danelle Berry, PA-C   12 months ago Hypertriglyceridemia   [facility] Berniece Salines, FNP   1 year ago Hiccups   [facility] Danelle Berry, PA-C   2 years ago Familial hypercholesterolemia   [facility] Danelle Berry, PA-C       Future Appointments             In 1 year Danelle Berry, PA-C [facility], [facility]

## 2022-06-19 ENCOUNTER — Ambulatory Visit: Payer: BC Managed Care – PPO | Admitting: Family Medicine

## 2022-09-21 ENCOUNTER — Encounter: Payer: Self-pay | Admitting: Family Medicine

## 2022-09-21 ENCOUNTER — Ambulatory Visit
Admission: RE | Admit: 2022-09-21 | Discharge: 2022-09-21 | Disposition: A | Discharge status: Home / Self Care | Payer: BC Managed Care – PPO | Attending: Family Medicine | Admitting: Family Medicine

## 2022-09-21 ENCOUNTER — Ambulatory Visit: Payer: BC Managed Care – PPO | Admitting: Family Medicine

## 2022-09-21 ENCOUNTER — Ambulatory Visit
Admission: RE | Admit: 2022-09-21 | Discharge: 2022-09-21 | Disposition: A | Discharge status: Home / Self Care | Payer: BC Managed Care – PPO | Source: Ambulatory Visit | Attending: Family Medicine | Admitting: Family Medicine

## 2022-09-21 VITALS — BP 134/82 | HR 86 | Temp 97.7°F | Resp 16 | Ht 68.0 in | Wt 161.2 lb

## 2022-09-21 DIAGNOSIS — R519 Headache, unspecified: Secondary | ICD-10-CM | POA: Insufficient documentation

## 2022-09-21 DIAGNOSIS — R066 Hiccough: Secondary | ICD-10-CM

## 2022-09-21 DIAGNOSIS — M542 Cervicalgia: Secondary | ICD-10-CM | POA: Diagnosis not present

## 2022-09-21 DIAGNOSIS — N951 Menopausal and female climacteric states: Secondary | ICD-10-CM | POA: Diagnosis not present

## 2022-09-21 DIAGNOSIS — M47813 Spondylosis without myelopathy or radiculopathy, cervicothoracic region: Secondary | ICD-10-CM | POA: Diagnosis not present

## 2022-09-21 MED ORDER — BACLOFEN 10 MG PO TABS: ORAL_TABLET | ORAL | 3 refills | Status: DC

## 2022-09-21 MED ORDER — VENLAFAXINE HCL ER 37.5 MG PO CP24: 37.5000 mg | ORAL_CAPSULE | Freq: Every day | ORAL | 1 refills | Status: DC

## 2022-09-21 NOTE — Progress Notes (Signed)
Patient ID: Cassidy Lewis, female    DOB: Dec 17, 1970, 52 y.o.   MRN: [id number]  PCP: Delsa Grana, PA-C  Chief Complaint  Patient presents with   Headache    Medication not as effective and more constant aches    Subjective:   Cassidy Lewis is a 52 y.o. female, presents to clinic with CC of the following:  All over, chronic since 2018  Poor sleep in general from menopausal sx  Headache  This is a chronic problem. The problem occurs intermittently. The problem has been gradually improving. Pain location: all over. The quality of the pain is described as throbbing and band-like (tight, sometimes by her temple stabbing). The pain is moderate. She has tried acetaminophen, Excedrin, triptans and NSAIDs for the symptoms.    HA's much less severe HA's 3/7 d a week  Trying not to take OTC meds often  Worse moods/periomenopausal sx?  Pt takes muscle relaxers previously for hiccups - which she's not having any more, sometimes neck pain/stiffness - sometimes this also triggers or worsens HA's  She was previously referred to neurology for HA work up and sleep study to r/o OSA as possible etiology of HA's but she never went to neurology    Patient Active Problem List   Diagnosis Date Noted   Familial hypercholesterolemia 11/11/2020   Complex cyst of left ovary 03/14/2015   Family history of ovarian cancer 03/03/2015   Vitamin D deficiency disease       Current Outpatient Medications:    EPINEPHrine 0.3 mg/0.3 mL IJ SOAJ injection, Inject 0.3 mLs (0.3 mg total) into the muscle as needed for anaphylaxis (for life-threatening allergic reaction)., Disp: 1 Device, Rfl: 1   meloxicam (MOBIC) 15 MG tablet, Take 1 tablet by mouth once daily, Disp: 30 tablet, Rfl: 0   nortriptyline (PAMELOR) 10 MG capsule, TAKE 1 CAPSULE BY MOUTH AT BEDTIME FOR CHRONIC DAILY HEADACHE, Disp: 90 capsule, Rfl: 0   rosuvastatin (CRESTOR) 20 MG tablet, Take 1 tablet (20 mg total) by mouth daily., Disp: 90  tablet, Rfl: 3   Ascorbic Acid (VITAMIN C ADULT GUMMIES PO), Take by mouth daily. (Patient not taking: Reported on 09/21/2022), Disp: , Rfl:    baclofen (LIORESAL) 10 MG tablet, TAKE 1 TABLET BY MOUTH THREE TIMES DAILY AS NEEDED FOR  MUSCLE  TENSION,  HICCUPS (Patient not taking: Reported on 09/21/2022), Disp: 90 tablet, Rfl: 3   vitamin B-12 (CYANOCOBALAMIN) 250 MCG tablet, Take 250 mcg by mouth daily. (Patient not taking: Reported on 09/21/2022), Disp: , Rfl:    VITAMIN E PO, Take 500 mg by mouth daily. (Patient not taking: Reported on 09/21/2022), Disp: , Rfl:    Allergies  Allergen Reactions   Coconut (Cocos Nucifera) Swelling   Penicillins Anaphylaxis   Macrobid [Nitrofurantoin]      Social History   Tobacco Use   Smoking status: Never   Smokeless tobacco: Never  Vaping Use   Vaping Use: Never used  Substance Use Topics   Alcohol use: Yes    Comment: very rare   Drug use: No      Chart Review Today: I personally reviewed active problem list, medication list, allergies, family history, social history, health maintenance, notes from last encounter, lab results, imaging with the patient/caregiver today.   Review of Systems  Constitutional: Negative.   HENT: Negative.    Eyes: Negative.   Respiratory: Negative.    Cardiovascular: Negative.   Gastrointestinal: Negative.   Endocrine: Negative.   Genitourinary: Negative.  Musculoskeletal: Negative.   Skin: Negative.   Allergic/Immunologic: Negative.   Neurological:  Positive for headaches.  Hematological: Negative.   Psychiatric/Behavioral: Negative.    All other systems reviewed and are negative.      Objective:   Vitals:   09/21/22 0944  BP: 134/82  Pulse: 86  Resp: 16  Temp: 97.7 F (36.5 C)  TempSrc: Oral  SpO2: 99%  Weight: 161 lb 3.2 oz (73.1 kg)  Height: '5\' 8"'$  (1.727 m)    Body mass index is 24.51 kg/m.  Physical Exam Vitals and nursing note reviewed.  Constitutional:      General: She is not  in acute distress.    Appearance: Normal appearance. She is well-developed and normal weight. She is not ill-appearing, toxic-appearing or diaphoretic.     Interventions: Face mask in place.  HENT:     Head: Normocephalic and atraumatic.     Right Ear: External ear normal.     Left Ear: External ear normal.  Eyes:     General: Lids are normal. No scleral icterus.       Right eye: No discharge.        Left eye: No discharge.     Extraocular Movements: Extraocular movements intact.     Conjunctiva/sclera: Conjunctivae normal.  Neck:     Trachea: Trachea and phonation normal. No tracheal deviation.  Cardiovascular:     Rate and Rhythm: Normal rate and regular rhythm.     Pulses: Normal pulses.          Radial pulses are 2+ on the right side and 2+ on the left side.       Posterior tibial pulses are 2+ on the right side and 2+ on the left side.     Heart sounds: Normal heart sounds. No murmur heard.    No friction rub. No gallop.  Pulmonary:     Effort: Pulmonary effort is normal. No respiratory distress.     Breath sounds: Normal breath sounds. No stridor. No wheezing, rhonchi or rales.  Chest:     Chest wall: No tenderness.  Abdominal:     General: Bowel sounds are normal. There is no distension.     Palpations: Abdomen is soft.  Musculoskeletal:     Cervical back: Full passive range of motion without pain and normal range of motion. No edema, erythema, rigidity or crepitus. No pain with movement, spinous process tenderness or muscular tenderness. Normal range of motion.     Right lower leg: No edema.     Left lower leg: No edema.  Skin:    General: Skin is warm and dry.     Coloration: Skin is not jaundiced or pale.     Findings: No rash.  Neurological:     General: No focal deficit present.     Mental Status: She is alert.     Cranial Nerves: Cranial nerves 2-12 are intact.     Sensory: Sensation is intact.     Motor: Motor function is intact. No abnormal muscle tone.      Coordination: Coordination is intact.     Gait: Gait normal.  Psychiatric:        Mood and Affect: Mood normal.        Speech: Speech normal.        Behavior: Behavior normal.      Results for orders placed or performed in visit on 01/19/22  CBC with Differential/Platelet  Result Value Ref Range   WBC 7.7 3.8 -  10.8 Thousand/uL   RBC 4.59 3.80 - 5.10 Million/uL   Hemoglobin 13.6 11.7 - 15.5 g/dL   HCT 39.6 35.0 - 45.0 %   MCV 86.3 80.0 - 100.0 fL   MCH 29.6 27.0 - 33.0 pg   MCHC 34.3 32.0 - 36.0 g/dL   RDW 12.0 11.0 - 15.0 %   Platelets 231 140 - 400 Thousand/uL   MPV 12.6 (H) 7.5 - 12.5 fL   Neutro Abs 5,013 1,500 - 7,800 cells/uL   Lymphs Abs 1,879 850 - 3,900 cells/uL   Absolute Monocytes 493 200 - 950 cells/uL   Eosinophils Absolute 246 15 - 500 cells/uL   Basophils Absolute 69 0 - 200 cells/uL   Neutrophils Relative % 65.1 %   Total Lymphocyte 24.4 %   Monocytes Relative 6.4 %   Eosinophils Relative 3.2 %   Basophils Relative 0.9 %  COMPLETE METABOLIC PANEL WITH GFR  Result Value Ref Range   Glucose, Bld 95 65 - 99 mg/dL   BUN 14 7 - 25 mg/dL   Creat 0.66 0.50 - 1.03 mg/dL   eGFR 105 > OR = 60 mL/min/1.68m   BUN/Creatinine Ratio SEE NOTE: 6 - 22 (calc)   Sodium 140 135 - 146 mmol/L   Potassium 4.5 3.5 - 5.3 mmol/L   Chloride 106 98 - 110 mmol/L   CO2 27 20 - 32 mmol/L   Calcium 9.6 8.6 - 10.4 mg/dL   Total Protein 6.9 6.1 - 8.1 g/dL   Albumin 4.2 3.6 - 5.1 g/dL   Globulin 2.7 1.9 - 3.7 g/dL (calc)   AG Ratio 1.6 1.0 - 2.5 (calc)   Total Bilirubin 0.4 0.2 - 1.2 mg/dL   Alkaline phosphatase (APISO) 111 37 - 153 U/L   AST 21 10 - 35 U/L   ALT 23 6 - 29 U/L  Lipid panel  Result Value Ref Range   Cholesterol 199 <200 mg/dL   HDL 53 > OR = 50 mg/dL   Triglycerides 239 (H) <150 mg/dL   LDL Cholesterol (Calc) 110 (H) mg/dL (calc)   Total CHOL/HDL Ratio 3.8 <5.0 (calc)   Non-HDL Cholesterol (Calc) 146 (H) <130 mg/dL (calc)  Hepatitis C antibody  Result Value  Ref Range   Hepatitis C Ab NON-REACTIVE NON-REACTIVE  Fecal Globin By Immunochemistry  Result Value Ref Range   MICRO NUMBER: [id number]   SPECIMEN QUALITY: Adequate    Source: INSURE (TM) FOBT TEST CARD    STATUS: FINAL    FECAL GLOBIN RESULT: Not Detected        Assessment & Plan:   1. Menopausal syndrome (hot flashes) Multiple symptoms which may be menopausal syndrome -  Poor sleep, worse moods Effexor trial -May take 1 to 2 months to see effect - venlafaxine XR (EFFEXOR XR) 37.5 MG 24 hr capsule; Take 1 capsule (37.5 mg total) by mouth daily with breakfast.  Dispense: 90 capsule; Refill: 1  2. Nonintractable headache, unspecified chronicity pattern, unspecified headache type Fairly chronic daily headaches began several years ago initially it improved significantly with nortriptyline and she was referred to neurology but she did not ever consult with a specialist Recently nortriptyline is not working as well she is having more frequent days of headaches with more severe headaches Very poor sleep as noted above may be an additional exacerbating factor May want to try to see if venlafaxine helps with that and helps overall headaches improve prior to increasing nortriptyline but I did explain another option is to increase  nortriptyline to 20 mg daily at bedtime and I do still strongly feel she needs to consult with a neurologist since headaches were very severe and began a few years ago without prior migraine history Still recommend ruling out sleep apnea with sleep study She is having some neck stiffness and pain that she thinks may also be triggering headaches -refill baclofen which she was initially using for hiccups - baclofen (LIORESAL) 10 MG tablet; TAKE 1 TABLET BY MOUTH THREE TIMES DAILY AS NEEDED FOR  MUSCLE  TENSION,  HICCUPS  Dispense: 90 tablet; Refill: 3 - DG Cervical Spine Complete; Future - Ambulatory referral to Neurology  3. 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 no midline tenderness/step-off no crepitus we we will get screening x-rays Headache specialist/neurology may also address whether or not neck pain is related it does not seem to be always triggering headaches since they are generalized throbbing usually present upon waking I do believe that worse neck pain exacerbates her headaches - baclofen (LIORESAL) 10 MG tablet; TAKE 1 TABLET BY MOUTH THREE TIMES DAILY AS NEEDED FOR  MUSCLE  TENSION,  HICCUPS  Dispense: 90 tablet; Refill: 3 - DG Cervical Spine Complete; Future   Discussed a plan to try treatments 1 at a time to that she could see what medication side effects or benefits there are prior to changing another medicine  Will do a 103-monthfollow-up on Effexor  Refer to neurology for further workup of chronic daily headaches for the past several years, patient continues to have no neurodeficit or red flags, overall I am not surprised that her headaches are worse with very poor sleep recently   LDelsa Grana PA-C 09/21/22 10:17 AM

## 2022-10-04 ENCOUNTER — Other Ambulatory Visit: Payer: Self-pay | Admitting: Family Medicine

## 2022-10-04 ENCOUNTER — Encounter: Payer: Self-pay | Admitting: Family Medicine

## 2022-10-04 DIAGNOSIS — R519 Headache, unspecified: Secondary | ICD-10-CM

## 2022-10-05 ENCOUNTER — Encounter: Payer: Self-pay | Admitting: Neurology

## 2022-11-23 ENCOUNTER — Encounter: Payer: Self-pay | Admitting: Family Medicine

## 2022-11-23 ENCOUNTER — Ambulatory Visit: Payer: BC Managed Care – PPO | Admitting: Family Medicine

## 2022-11-23 VITALS — BP 132/78 | HR 97 | Temp 97.7°F | Resp 16 | Ht 68.0 in | Wt 155.5 lb

## 2022-11-23 DIAGNOSIS — R519 Headache, unspecified: Secondary | ICD-10-CM | POA: Diagnosis not present

## 2022-11-23 DIAGNOSIS — R197 Diarrhea, unspecified: Secondary | ICD-10-CM

## 2022-11-23 DIAGNOSIS — R112 Nausea with vomiting, unspecified: Secondary | ICD-10-CM | POA: Diagnosis not present

## 2022-11-23 DIAGNOSIS — N951 Menopausal and female climacteric states: Secondary | ICD-10-CM | POA: Diagnosis not present

## 2022-11-23 DIAGNOSIS — M542 Cervicalgia: Secondary | ICD-10-CM | POA: Diagnosis not present

## 2022-11-23 DIAGNOSIS — G4700 Insomnia, unspecified: Secondary | ICD-10-CM

## 2022-11-23 MED ORDER — ONDANSETRON 4 MG PO TBDP: 4.0000 mg | ORAL_TABLET | Freq: Three times a day (TID) | ORAL | 1 refills | Status: DC | PRN

## 2022-11-23 NOTE — Progress Notes (Signed)
Patient ID: Cassidy Lewis, female    DOB: 1970/11/28, 52 y.o.   MRN: [id number]  PCP: Danelle Berry, PA-C  Chief Complaint  Patient presents with   Emesis    Everyday since Mon, unable to sit food down   Headache    Persistent aches   Nausea    everyday   Diarrhea    Started Monday, no more after that    Subjective:   Cassidy Lewis is a 52 y.o. female, presents to clinic with CC of the following:   Acute illness onset suddenly on Monday N/V/D  Wed night tried to eat again and had vomiting again Persistent nausea all week Diarrhea was really bad but none since Monday She feels a little weak and dehydrated and is afraid to eat food She has drinking a bottle of water and has urinated today She is not currently having abdominal pain No fever, sweats, chills Slight increase in her headache -significant chronic headache history, she has not been able to take her medications    Patient Active Problem List   Diagnosis Date Noted   Familial hypercholesterolemia 11/11/2020   Complex cyst of left ovary 03/14/2015   Family history of ovarian cancer 03/03/2015   Vitamin D deficiency disease       Current Outpatient Medications:    Ascorbic Acid (VITAMIN C ADULT GUMMIES PO), Take by mouth daily., Disp: , Rfl:    baclofen (LIORESAL) 10 MG tablet, TAKE 1 TABLET BY MOUTH THREE TIMES DAILY AS NEEDED FOR  MUSCLE  TENSION,  HICCUPS, Disp: 90 tablet, Rfl: 3   EPINEPHrine 0.3 mg/0.3 mL IJ SOAJ injection, Inject 0.3 mLs (0.3 mg total) into the muscle as needed for anaphylaxis (for life-threatening allergic reaction)., Disp: 1 Device, Rfl: 1   meloxicam (MOBIC) 15 MG tablet, Take 1 tablet by mouth once daily, Disp: 30 tablet, Rfl: 0   nortriptyline (PAMELOR) 10 MG capsule, TAKE 1 CAPSULE BY MOUTH AT BEDTIME FOR CHRONIC DAILY HEADACHE, Disp: 90 capsule, Rfl: 0   rosuvastatin (CRESTOR) 20 MG tablet, Take 1 tablet (20 mg total) by mouth daily., Disp: 90 tablet, Rfl: 3   venlafaxine XR  (EFFEXOR XR) 37.5 MG 24 hr capsule, Take 1 capsule (37.5 mg total) by mouth daily with breakfast., Disp: 90 capsule, Rfl: 1   vitamin B-12 (CYANOCOBALAMIN) 250 MCG tablet, Take 250 mcg by mouth daily., Disp: , Rfl:    VITAMIN E PO, Take 500 mg by mouth daily., Disp: , Rfl:    Allergies  Allergen Reactions   Coconut (Cocos Nucifera) Swelling   Penicillins Anaphylaxis   Macrobid [Nitrofurantoin]      Social History   Tobacco Use   Smoking status: Never   Smokeless tobacco: Never  Vaping Use   Vaping Use: Never used  Substance Use Topics   Alcohol use: Yes    Comment: very rare   Drug use: No      Chart Review Today: I personally reviewed active problem list, medication list, allergies, family history, social history, health maintenance, notes from last encounter, lab results, imaging with the patient/caregiver today.   Review of Systems  Constitutional: Negative.   HENT: Negative.    Eyes: Negative.   Respiratory: Negative.    Cardiovascular: Negative.   Gastrointestinal: Negative.   Endocrine: Negative.   Genitourinary: Negative.   Musculoskeletal: Negative.   Skin: Negative.   Allergic/Immunologic: Negative.   Neurological: Negative.   Hematological: Negative.   Psychiatric/Behavioral: Negative.    All other systems reviewed  and are negative.      Objective:   Vitals:   11/23/22 0921  BP: 132/78  Pulse: 97  Resp: 16  Temp: 97.7 F (36.5 C)  TempSrc: Oral  SpO2: 98%  Weight: 155 lb 8 oz (70.5 kg)  Height:  (1.727 m)    Body mass index is 23.64 kg/m.  Physical Exam Vitals and nursing note reviewed.  Constitutional:      General: She is not in acute distress.    Appearance: Normal appearance. She is well-developed. She is not ill-appearing, toxic-appearing or diaphoretic.     Interventions: Face mask in place.  HENT:     Head: Normocephalic and atraumatic.     Right Ear: External ear normal.     Left Ear: External ear normal.      Mouth/Throat:     Mouth: Mucous membranes are moist.     Pharynx: No oropharyngeal exudate or posterior oropharyngeal erythema.  Eyes:     General: Lids are normal. No scleral icterus.       Right eye: No discharge.        Left eye: No discharge.     Conjunctiva/sclera: Conjunctivae normal.  Neck:     Trachea: Phonation normal. No tracheal deviation.  Cardiovascular:     Rate and Rhythm: Normal rate and regular rhythm.     Pulses: Normal pulses.          Radial pulses are 2+ on the right side and 2+ on the left side.       Posterior tibial pulses are 2+ on the right side and 2+ on the left side.     Heart sounds: Normal heart sounds. No murmur heard.    No friction rub. No gallop.  Pulmonary:     Effort: Pulmonary effort is normal. No respiratory distress.     Breath sounds: Normal breath sounds. No stridor. No wheezing, rhonchi or rales.  Chest:     Chest wall: No tenderness.  Abdominal:     General: Bowel sounds are normal. There is no distension.     Palpations: Abdomen is soft.     Tenderness: There is no abdominal tenderness. There is no right CVA tenderness, left CVA tenderness or guarding.  Musculoskeletal:     Right lower leg: No edema.     Left lower leg: No edema.  Skin:    General: Skin is warm and dry.     Coloration: Skin is not jaundiced or pale.     Findings: No rash.  Neurological:     Mental Status: She is alert.     Motor: No abnormal muscle tone.     Gait: Gait normal.  Psychiatric:        Mood and Affect: Mood normal.        Speech: Speech normal.        Behavior: Behavior normal.      Results for orders placed or performed in visit on 01/19/22  CBC with Differential/Platelet  Result Value Ref Range   WBC 7.7 3.8 - 10.8 Thousand/uL   RBC 4.59 3.80 - 5.10 Million/uL   Hemoglobin 13.6 11.7 - 15.5 g/dL   HCT 16.1 09.6 - 04.5 %   MCV 86.3 80.0 - 100.0 fL   MCH 29.6 27.0 - 33.0 pg   MCHC 34.3 32.0 - 36.0 g/dL   RDW 40.9 81.1 - 91.4 %   Platelets  231 140 - 400 Thousand/uL   MPV 12.6 (H) 7.5 - 12.5 fL  Neutro Abs 5,013 1,500 - 7,800 cells/uL   Lymphs Abs 1,879 850 - 3,900 cells/uL   Absolute Monocytes 493 200 - 950 cells/uL   Eosinophils Absolute 246 15 - 500 cells/uL   Basophils Absolute 69 0 - 200 cells/uL   Neutrophils Relative % 65.1 %   Total Lymphocyte 24.4 %   Monocytes Relative 6.4 %   Eosinophils Relative 3.2 %   Basophils Relative 0.9 %  COMPLETE METABOLIC PANEL WITH GFR  Result Value Ref Range   Glucose, Bld 95 65 - 99 mg/dL   BUN 14 7 - 25 mg/dL   Creat 1.61 0.96 - 0.45 mg/dL   eGFR 409 > OR = 60 WJ/XBJ/4.78G9   BUN/Creatinine Ratio SEE NOTE: 6 - 22 (calc)   Sodium 140 135 - 146 mmol/L   Potassium 4.5 3.5 - 5.3 mmol/L   Chloride 106 98 - 110 mmol/L   CO2 27 20 - 32 mmol/L   Calcium 9.6 8.6 - 10.4 mg/dL   Total Protein 6.9 6.1 - 8.1 g/dL   Albumin 4.2 3.6 - 5.1 g/dL   Globulin 2.7 1.9 - 3.7 g/dL (calc)   AG Ratio 1.6 1.0 - 2.5 (calc)   Total Bilirubin 0.4 0.2 - 1.2 mg/dL   Alkaline phosphatase (APISO) 111 37 - 153 U/L   AST 21 10 - 35 U/L   ALT 23 6 - 29 U/L  Lipid panel  Result Value Ref Range   Cholesterol 199 <200 mg/dL   HDL 53 > OR = 50 mg/dL   Triglycerides 562 (H) <150 mg/dL   LDL Cholesterol (Calc) 110 (H) mg/dL (calc)   Total CHOL/HDL Ratio 3.8 <5.0 (calc)   Non-HDL Cholesterol (Calc) 146 (H) <130 mg/dL (calc)  Hepatitis C antibody  Result Value Ref Range   Hepatitis C Ab NON-REACTIVE NON-REACTIVE  Fecal Globin By Immunochemistry  Result Value Ref Range   MICRO NUMBER: [id number]    SPECIMEN QUALITY: Adequate    Source: INSURE (TM) FOBT TEST CARD    STATUS: FINAL    FECAL GLOBIN RESULT: Not Detected        Assessment & Plan:   Pt had scheduled f/up on sx and review of new meds (effexor) but she was acutely ill this week onset of sx on Monday  Prior plan:   Sx are better on effexor - no SE or concerns, she will continue med 1. Menopausal syndrome (hot flashes) -  Multiple symptoms  which may be menopausal syndrome -  Poor sleep, worse moods Effexor trial -May take 1 to 2 months to see effect - venlafaxine XR (EFFEXOR XR) 37.5 MG 24 hr capsule; Take 1 capsule (37.5 mg total) by mouth daily with breakfast.  Dispense: 90 capsule; Refill: 1   See below - she has upcoming specialists eval/consult - no changes to meds or plan at this time - hopefully with antiemetics she can rehydrate and resume taking her medications 2. Nonintractable headache, unspecified chronicity pattern, unspecified headache type Fairly chronic daily headaches began several years ago initially it improved significantly with nortriptyline and she was referred to neurology but she did not ever consult with a specialist Recently nortriptyline is not working as well she is having more frequent days of headaches with more severe headaches Very poor sleep as noted above may be an additional exacerbating factor May want to try to see if venlafaxine helps with that and helps overall headaches improve prior to increasing nortriptyline but I did explain another option is to increase nortriptyline  to 20 mg daily at bedtime and I do still strongly feel she needs to consult with a neurologist since headaches were very severe and began a few years ago without prior migraine history Still recommend ruling out sleep apnea with sleep study She is having some neck stiffness and pain that she thinks may also be triggering headaches -refill baclofen which she was initially using for hiccups - baclofen (LIORESAL) 10 MG tablet; TAKE 1 TABLET BY MOUTH THREE TIMES DAILY AS NEEDED FOR  MUSCLE  TENSION,  HICCUPS  Dispense: 90 tablet; Refill: 3 - DG Cervical Spine Complete; Future - Ambulatory referral to Neurology   No changes to plan 3. 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 Nausea vomiting and diarrhea  R11.2 ondansetron (ZOFRAN-ODT) 4 MG disintegrating tablet   R19.7    suspect GI virus, antiemetics and slow progression of fluids and diet, return to work after tolerating po's and no diarrhea    2. Menopausal syndrome (hot flashes)  N95.1    sx have improved on effexor    3. Nonintractable headache, unspecified chronicity pattern, unspecified headache type  R51.9    slightly worse than baseline, which is understandable with acute illness, being off meds, and probably some mild dehydration    4. Neck pain  M54.2    unchanged    5. Insomnia, unspecified type  G47.00    sx improved with effexor       Danelle Berry, PA-C 11/23/22 9:42 AM

## 2022-12-26 NOTE — Progress Notes (Signed)
NEUROLOGY CONSULTATION NOTE  Cassidy Lewis MRN: [id number] DOB: November 19, 1970  Referring provider: Danelle Berry, PA-C Primary care provider: Danelle Berry, PA-C  Reason for consult:  migraines  Assessment/Plan:   Migraine without aura, without status migrainosus, not intractable  As she continues to have frequent headaches, will check MRI of brain Migraine prevention:  Increase nortriptyline to 25mg  at bedtime.  We can increase to 50mg  in 4-6 weeks if needed.  On venlafaxine for hot flashes, which may be helpful as well Migraine rescue:  Tylenol Limit use of pain relievers to no more than 2 days out of week to prevent risk of rebound or medication-overuse headache. Keep headache diary Follow up 6 months.    Subjective:  Cassidy Lewis is a 52 year old female who presents for migraines.  History supplemented by referring provider's note.  Onset:  On and off since around age 61, around to time she started perimenopause.  No prior history of headaches. Location:  diffuse but can be unilateral, sometimes base of skull hurts.  No neck pain.   Quality:  pulsating Intensity:  5-6/10 (8/10 prior to nortriptyline) Aura:  absent Prodrome:  absent Associated symptoms:  Sometimes photophobia, phonophobia or sees small spots in her vision.  Occasional nausea.  She denies associated unilateral numbness or weakness. Duration:  10 minutes with Tylenol Frequency:  3 to 4 days a week Frequency of abortive medication: 3 to 4 days a week Triggers:  noise, computer screens Relieving factors:  sleep Activity:  aggravates  Cervical X-ray from 09/21/2022 personally reviewed showed mild bilateral facet arthropathy at C7-T1 but no acute abnormalities.  Past NSAIDS/analgesics:  Excedrin, ibuprofen, meloxicam Past abortive triptans:  none Past abortive ergotamine:  none Past muscle relaxants:  none Past anti-emetic:  none Past antihypertensive medications:  none Past antidepressant medications:   none Past anticonvulsant medications:  none Past anti-CGRP:  none Past vitamins/Herbal/Supplements:  B12 Other past therapies:  none  Current NSAIDS/analgesics:  Tylenol Current triptans:  none Current ergotamine:  none Current anti-emetic:  Zofran ODT 4mg  Current muscle relaxants:  baclofen 10mg  TID PRN Current Antihypertensive medications:  none Current Antidepressant medications:  nortriptyline 10mg  QHS, venlafaxine XR 37.5mg  daily Current Anticonvulsant medications:  none Current anti-CGRP:  none Current Vitamins/Herbal/Supplements:  none Current Antihistamines/Decongestants:  none Other therapy:  none   Caffeine:  1 cup coffee daily.  1 Pepsi with dinner Diet:  32 oz water daily. Exercise:  goes to gym twice a week Depression:  no; Anxiety:  no Other pain:  no Sleep hygiene:  Hot flashes affect sleep Tends to occur more when she is at work.  Works in the [employer] at [employer].  Wears headphone and reads the orders on screens.  Works from 3:30 AM to 2:00 PM.  Tries to go to bed at 6:30 PM and wakes up 2:15 AM.  History of partial hysterectomy in her 30s (endometriosis, family history of ovarian cancer) Family history of headache:  sister (cervicogenic headache)      PAST MEDICAL HISTORY: Past Medical History:  Diagnosis Date   Endometriosis    Irritability 07/25/2018   Left flank pain 05/28/2017   Ovarian cyst    Pelvic fullness in female 03/03/2015   Vitamin B12 deficiency    Vitamin D deficiency disease     PAST SURGICAL HISTORY: Past Surgical History:  Procedure Laterality Date   ABDOMINAL HYSTERECTOMY     Total   CESAREAN SECTION     x 3   DILATION AND CURETTAGE OF  UTERUS      MEDICATIONS: Current Outpatient Medications on File Prior to Visit  Medication Sig Dispense Refill   Ascorbic Acid (VITAMIN C ADULT GUMMIES PO) Take by mouth daily.     baclofen (LIORESAL) 10 MG tablet TAKE 1 TABLET BY MOUTH THREE TIMES DAILY AS NEEDED FOR  MUSCLE  TENSION,   HICCUPS 90 tablet 3   EPINEPHrine 0.3 mg/0.3 mL IJ SOAJ injection Inject 0.3 mLs (0.3 mg total) into the muscle as needed for anaphylaxis (for life-threatening allergic reaction). 1 Device 1   nortriptyline (PAMELOR) 10 MG capsule TAKE 1 CAPSULE BY MOUTH AT BEDTIME FOR CHRONIC DAILY HEADACHE 90 capsule 0   ondansetron (ZOFRAN-ODT) 4 MG disintegrating tablet Take 1-2 tablets (4-8 mg total) by mouth every 8 (eight) hours as needed for nausea or vomiting. 30 tablet 1   rosuvastatin (CRESTOR) 20 MG tablet Take 1 tablet (20 mg total) by mouth daily. 90 tablet 3   venlafaxine XR (EFFEXOR XR) 37.5 MG 24 hr capsule Take 1 capsule (37.5 mg total) by mouth daily with breakfast. 90 capsule 1   vitamin B-12 (CYANOCOBALAMIN) 250 MCG tablet Take 250 mcg by mouth daily.     VITAMIN E PO Take 500 mg by mouth daily.     No current facility-administered medications on file prior to visit.    ALLERGIES: Allergies  Allergen Reactions   Coconut (Cocos Nucifera) Swelling   Penicillins Anaphylaxis   Macrobid [Nitrofurantoin]     FAMILY HISTORY: Family History  Problem Relation Age of Onset   Cancer Mother        ovarian   Stroke Mother        possibly a small stroke   Depression Mother        Bipolar   Diabetes Father    Hyperlipidemia Father    Hypertension Father    Cancer Father        stomach cancer   Cancer Paternal Grandfather        lung cancer, died at age 37   Diabetes Maternal Grandmother    Gout Maternal Grandmother    Stroke Sister        fraction strokes   Endometriosis Sister    Migraines Sister    Heart failure Paternal Grandmother    COPD Neg Hx    Heart disease Neg Hx     Objective:  Blood pressure (!) 127/95, pulse 95, height 5\' 7"  (1.702 m), weight 157 lb 6.4 oz (71.4 kg), SpO2 95 %. General: No acute distress.  Patient appears well-groomed.   Head:  Normocephalic/atraumatic Eyes:  fundi examined but not visualized Neck: supple, no paraspinal tenderness, full range of  motion Back: No paraspinal tenderness Heart: regular rate and rhythm Lungs: Clear to auscultation bilaterally. Vascular: No carotid bruits. Neurological Exam: Mental status: alert and oriented to person, place, and time, speech fluent and not dysarthric, language intact. Cranial nerves: CN I: not tested CN II: pupils equal, round and reactive to light, visual fields intact CN III, IV, VI:  full range of motion, no nystagmus, no ptosis CN V: facial sensation intact. CN VII: upper and lower face symmetric CN VIII: hearing intact CN IX, X: gag intact, uvula midline CN XI: sternocleidomastoid and trapezius muscles intact CN XII: tongue midline Bulk & Tone: normal, no fasciculations. Motor:  muscle strength 5/5 throughout Sensation:  Pinprick, temperature and vibratory sensation intact. Deep Tendon Reflexes:  2+ throughout,  toes downgoing.   Finger to nose testing:  Without dysmetria.  Heel to shin:  Without dysmetria.   Gait:  Normal station and stride.  Romberg negative.    Thank you for allowing me to take part in the care of this patient.  Shon Millet, DO  CC: Danelle Berry, PA-C

## 2022-12-28 ENCOUNTER — Encounter: Payer: Self-pay | Admitting: Neurology

## 2022-12-28 ENCOUNTER — Ambulatory Visit: Payer: BC Managed Care – PPO | Admitting: Neurology

## 2022-12-28 VITALS — BP 127/95 | HR 95 | Ht 67.0 in | Wt 157.4 lb

## 2022-12-28 DIAGNOSIS — G43009 Migraine without aura, not intractable, without status migrainosus: Secondary | ICD-10-CM | POA: Diagnosis not present

## 2022-12-28 MED ORDER — NORTRIPTYLINE HCL 25 MG PO CAPS: 25.0000 mg | ORAL_CAPSULE | Freq: Every day | ORAL | 5 refills | Status: DC

## 2022-12-28 NOTE — Patient Instructions (Addendum)
  MRI of brain with and without contrast Increase nortriptyline to 25mg  at bedtime.  Contact us in 4 to 6 weeks with update and we can increase dose if needed. Take Tylenol at earliest onset of headache.  Limit use to no more than 2 days out of the week.   This will help reduce risk of rebound headaches. Routine exercise Stay adequately hydrated (aim for 64 oz water daily) Keep headache diary Maintain proper stress management Maintain proper sleep hygiene Do not skip meals Consider supplements:  magnesium citrate 400mg  daily, riboflavin 400mg  daily, coenzyme Q10 300mg  daily.

## 2023-02-01 ENCOUNTER — Ambulatory Visit: Payer: BC Managed Care – PPO | Admitting: Neurology

## 2023-04-15 ENCOUNTER — Other Ambulatory Visit: Payer: Self-pay | Admitting: Family Medicine

## 2023-04-15 DIAGNOSIS — N951 Menopausal and female climacteric states: Secondary | ICD-10-CM

## 2023-04-16 DIAGNOSIS — M533 Sacrococcygeal disorders, not elsewhere classified: Secondary | ICD-10-CM | POA: Diagnosis not present

## 2023-04-16 DIAGNOSIS — M5137 Other intervertebral disc degeneration, lumbosacral region: Secondary | ICD-10-CM | POA: Diagnosis not present

## 2023-04-16 DIAGNOSIS — M545 Low back pain, unspecified: Secondary | ICD-10-CM | POA: Diagnosis not present

## 2023-04-16 DIAGNOSIS — M47816 Spondylosis without myelopathy or radiculopathy, lumbar region: Secondary | ICD-10-CM | POA: Diagnosis not present

## 2023-06-21 ENCOUNTER — Encounter: Payer: BC Managed Care – PPO | Admitting: Family Medicine

## 2023-06-25 ENCOUNTER — Ambulatory Visit (INDEPENDENT_AMBULATORY_CARE_PROVIDER_SITE_OTHER): Payer: BC Managed Care – PPO | Admitting: Physician Assistant

## 2023-06-25 ENCOUNTER — Encounter: Payer: Self-pay | Admitting: Physician Assistant

## 2023-06-25 VITALS — BP 122/80 | HR 93 | Temp 97.3°F | Resp 16 | Ht 67.0 in | Wt 162.7 lb

## 2023-06-25 DIAGNOSIS — Z0000 Encounter for general adult medical examination without abnormal findings: Secondary | ICD-10-CM

## 2023-06-25 DIAGNOSIS — Z7189 Other specified counseling: Secondary | ICD-10-CM | POA: Insufficient documentation

## 2023-06-25 HISTORY — DX: Other specified counseling: Z71.89

## 2023-06-25 NOTE — Progress Notes (Unsigned)
Annual Physical Exam   Name: Cassidy Lewis   MRN: [id number]    DOB: 31-May-1971   Date:06/26/2023  Today's Provider: Jacquelin Hawking, MHS, PA-C Introduced myself to the patient as a PA-C and provided education on APPs in clinical practice.         Subjective  Chief Complaint  Chief Complaint  Patient presents with   Annual Exam    HPI  Patient presents for annual CPE.  Diet:  Overall normal diet,  Exercise: she is going the gym MW and then rides bike for about 4 miles   Sleep:"it stinks" she has trouble with sleep maintenance- mainly from hot flashes  Mood: denies mood concerns   Flowsheet Row Office Visit from 06/15/2022 in [facility]  AUDIT-C Score 1      Depression: Phq 9 is  negative    06/25/2023    2:47 PM 11/23/2022    9:20 AM 09/21/2022    9:44 AM 06/15/2022   10:55 AM 01/19/2022    1:52 PM  Depression screen PHQ 2/9  Decreased Interest 0 0 0 0 0  Down, Depressed, Hopeless 0 0 0 0 0  PHQ - 2 Score 0 0 0 0 0  Altered sleeping 0 0 0 0 0  Tired, decreased energy 0 0 0 0 0  Change in appetite 0 0 0 0 0  Feeling bad or failure about yourself  0 0 0 0 0  Trouble concentrating 0 0 0 0 0  Moving slowly or fidgety/restless 0 0 0 0 0  Suicidal thoughts 0 0 0 0 0  PHQ-9 Score 0 0 0 0 0  Difficult doing work/chores Not difficult at all Not difficult at all Not difficult at all Not difficult at all Not difficult at all   Hypertension: BP Readings from Last 3 Encounters:  06/25/23 122/80  12/28/22 (!) 127/95  11/23/22 132/78   Obesity: Wt Readings from Last 3 Encounters:  06/25/23 162 lb 11.2 oz (73.8 kg)  12/28/22 157 lb 6.4 oz (71.4 kg)  11/23/22 155 lb 8 oz (70.5 kg)   BMI Readings from Last 3 Encounters:  06/25/23 25.48 kg/m  12/28/22 24.65 kg/m  11/23/22 23.64 kg/m     Health Maintenance  Topic Date Due   COVID-19 Vaccine (1) 07/11/2023*   Mammogram  06/24/2024*   Colon Cancer Screening  06/24/2024*    DTaP/Tdap/Td vaccine (3 - Td or Tdap) 10/01/2029   Hepatitis C Screening  Completed   HIV Screening  Completed   HPV Vaccine  Aged Out   Flu Shot  Discontinued   Zoster (Shingles) Vaccine  Discontinued  *Topic was postponed. The date shown is not the original due date.     STD testing and prevention (HIV/chl/gon/syphilis): declines  Intimate partner violence: negative Sexual History: She is sexually active with her husband Menstrual History/LMP/Abnormal Bleeding: she has had total abdominal hysterectomy - thinks she may still have ovaries? Discussed importance of follow up if any post-menopausal bleeding: not applicable Incontinence Symptoms: No.  Breast cancer hx:  - Last Mammogram: she declines this today- states she will schedule later  - BRCA gene screening:   Osteoporosis Prevention : Discussed high calcium and vitamin D supplementation, weight bearing exercises Bone density :not applicable  Cervical cancer screening: total abdominal hysterectomy   Skin cancer: Discussed monitoring for atypical lesions  Colorectal cancer screening: declines cologuard and colonoscopy. Father has hx of colon cancer, dx when he was 56  Reviewed that colon cancer screening is recommended given family hx  Lung cancer:  Low Dose CT Chest recommended if Age 71-80 years, 20 pack-year currently smoking OR have quit w/in 15years. Patient does not qualify.   ECG: NA  Advanced Care Planning: A voluntary discussion about advance care planning including the explanation and discussion of advance directives.  Discussed health care proxy and Living will, and the patient was able to identify a health care proxy as her husband.  Patient does not have a living will in effect.  Lipids: Lab Results  Component Value Date   CHOL 199 05/25/2022   CHOL 172 06/23/2021   CHOL 255 (H) 11/11/2020   Lab Results  Component Value Date   HDL 53 05/25/2022   HDL 50 06/23/2021   HDL 44 (L) 11/11/2020   Lab Results   Component Value Date   LDLCALC 110 (H) 05/25/2022   LDLCALC 91 06/23/2021   LDLCALC  11/11/2020     Comment:     . LDL cholesterol not calculated. Triglyceride levels greater than 400 mg/dL invalidate calculated LDL results. . Reference range: <100 . Desirable range <100 mg/dL for primary prevention;   <70 mg/dL for patients with CHD or diabetic patients  with > or = 2 CHD risk factors. Marland Kitchen LDL-C is now calculated using the Martin-Hopkins  calculation, which is a validated novel method providing  better accuracy than the Friedewald equation in the  estimation of LDL-C.  Horald Pollen et al. Lenox Ahr. 8295;621(30): 2061-2068  (http://education.[facility].com/faq/FAQ164)    Lab Results  Component Value Date   TRIG 239 (H) 05/25/2022   TRIG 214 (H) 06/23/2021   TRIG 521 (H) 11/11/2020   Lab Results  Component Value Date   CHOLHDL 3.8 05/25/2022   CHOLHDL 3.4 06/23/2021   CHOLHDL 5.8 (H) 11/11/2020   No results found for: "LDLDIRECT"  Glucose: Glucose, Bld  Date Value Ref Range Status  05/25/2022 95 65 - 99 mg/dL Final    Comment:    .            Fasting reference interval .   06/23/2021 91 65 - 99 mg/dL Final    Comment:    .            Fasting reference interval .   11/11/2020 98 65 - 99 mg/dL Final    Comment:    .            Fasting reference interval .     Patient Active Problem List   Diagnosis Date Noted   Familial hypercholesterolemia 11/11/2020   Complex cyst of left ovary 03/14/2015   Family history of ovarian cancer 03/03/2015   Vitamin D deficiency disease     Past Surgical History:  Procedure Laterality Date   ABDOMINAL HYSTERECTOMY     Total   CESAREAN SECTION     x 3   DILATION AND CURETTAGE OF UTERUS      Family History  Problem Relation Age of Onset   Cancer Mother        ovarian   Stroke Mother        possibly a small stroke   Depression Mother        Bipolar   Diabetes Father    Hyperlipidemia Father    Hypertension  Father    Cancer Father        stomach cancer   Cancer Paternal Grandfather        lung cancer, died at age 71  Diabetes Maternal Grandmother    Gout Maternal Grandmother    Stroke Sister        fraction strokes   Endometriosis Sister    Migraines Sister    Heart failure Paternal Grandmother    COPD Neg Hx    Heart disease Neg Hx     Social History   Socioeconomic History   Marital status: Married    Spouse name: Ronnie   Number of children: 3   Years of education: Not on file   Highest education level: GED or equivalent  Occupational History   Not on file  Tobacco Use   Smoking status: Never   Smokeless tobacco: Never  Vaping Use   Vaping status: Never Used  Substance and Sexual Activity   Alcohol use: Yes    Comment: very rare   Drug use: No   Sexual activity: Yes    Partners: Male    Birth control/protection: None, Surgical  Other Topics Concern   Not on file  Social History Narrative   Not on file   Social Determinants of Health   Financial Resource Strain: Low Risk  (09/05/2018)   Overall Financial Resource Strain (CARDIA)    Difficulty of Paying Living Expenses: Not hard at all  Recent Concern: Financial Resource Strain - Medium Risk (07/25/2018)   Overall Financial Resource Strain (CARDIA)    Difficulty of Paying Living Expenses: Somewhat hard  Food Insecurity: No Food Insecurity (07/25/2018)   Hunger Vital Sign    Worried About Running Out of Food in the Last Year: Never true    Ran Out of Food in the Last Year: Never true  Transportation Needs: No Transportation Needs (07/25/2018)   PRAPARE - Administrator, Civil Service (Medical): No    Lack of Transportation (Non-Medical): No  Physical Activity: Sufficiently Active (06/15/2022)   Exercise Vital Sign    Days of Exercise per Week: 7 days    Minutes of Exercise per Session: 30 min  Stress: No Stress Concern Present (09/05/2018)   [employer] of Occupational Health - Occupational  Stress Questionnaire    Feeling of Stress : Not at all  Recent Concern: Stress - Stress Concern Present (07/25/2018)   [employer] of Occupational Health - Occupational Stress Questionnaire    Feeling of Stress : Very much  Social Connections: Unknown (09/05/2018)   Social Connection and Isolation Panel [NHANES]    Frequency of Communication with Friends and Family: Not on file    Frequency of Social Gatherings with Friends and Family: Not on file    Attends Religious Services: Never    Active Member of Clubs or Organizations: Not on file    Attends Banker Meetings: Not on file    Marital Status: Not on file  Intimate Partner Violence: Not At Risk (06/18/2017)   Humiliation, Afraid, Rape, and Kick questionnaire    Fear of Current or Ex-Partner: No    Emotionally Abused: No    Physically Abused: No    Sexually Abused: No     Current Outpatient Medications:    EPINEPHrine 0.3 mg/0.3 mL IJ SOAJ injection, Inject 0.3 mLs (0.3 mg total) into the muscle as needed for anaphylaxis (for life-threatening allergic reaction)., Disp: 1 Device, Rfl: 1   nortriptyline (PAMELOR) 25 MG capsule, Take 1 capsule (25 mg total) by mouth at bedtime., Disp: 30 capsule, Rfl: 5   Ascorbic Acid (VITAMIN C ADULT GUMMIES PO), Take by mouth daily. (Patient not taking: Reported on 06/25/2023),  Disp: , Rfl:    rosuvastatin (CRESTOR) 20 MG tablet, Take 1 tablet (20 mg total) by mouth daily. (Patient not taking: Reported on 06/25/2023), Disp: 90 tablet, Rfl: 3   vitamin B-12 (CYANOCOBALAMIN) 250 MCG tablet, Take 250 mcg by mouth daily. (Patient not taking: Reported on 12/28/2022), Disp: , Rfl:   Allergies  Allergen Reactions   Coconut (Cocos Nucifera) Swelling   Penicillins Anaphylaxis   Macrobid [Nitrofurantoin]      Review of Systems  Constitutional:  Negative for chills, fever, malaise/fatigue and weight loss.  HENT:  Negative for hearing loss, nosebleeds, sore throat and tinnitus.   Eyes:   Negative for blurred vision, double vision and photophobia.  Respiratory:  Negative for cough, shortness of breath and wheezing.   Cardiovascular:  Negative for chest pain, palpitations and leg swelling.  Gastrointestinal:  Negative for blood in stool, constipation, diarrhea, heartburn, nausea and vomiting.  Genitourinary:  Negative for dysuria and frequency.  Musculoskeletal:  Negative for falls, joint pain and myalgias.  Skin:  Negative for itching and rash.  Neurological:  Positive for headaches (chronic, has MRI scheduled with Neurology). Negative for dizziness, tingling, tremors, loss of consciousness and weakness.  Psychiatric/Behavioral:  Negative for depression, memory loss and suicidal ideas. The patient has insomnia. The patient is not nervous/anxious.       Objective  Vitals:   06/25/23 1447  BP: 122/80  Pulse: 93  Resp: 16  Temp: (!) 97.3 F (36.3 C)  TempSrc: Temporal  SpO2: 99%  Weight: 162 lb 11.2 oz (73.8 kg)  Height: 5\' 7"  (1.702 m)    Body mass index is 25.48 kg/m.  Physical Exam Vitals reviewed.  Constitutional:      General: She is awake.     Appearance: Normal appearance. She is well-developed and well-groomed.  HENT:     Head: Normocephalic and atraumatic.     Right Ear: Hearing, tympanic membrane, ear canal and external ear normal.     Left Ear: Hearing, tympanic membrane, ear canal and external ear normal.     Nose: Nose normal.     Mouth/Throat:     Lips: Pink.     Mouth: Mucous membranes are moist.     Pharynx: Oropharynx is clear. No oropharyngeal exudate or posterior oropharyngeal erythema.  Eyes:     General: Lids are normal. Gaze aligned appropriately.     Extraocular Movements: Extraocular movements intact.     Conjunctiva/sclera: Conjunctivae normal.     Pupils: Pupils are equal, round, and reactive to light.  Neck:     Thyroid: No thyroid mass, thyromegaly or thyroid tenderness.  Cardiovascular:     Rate and Rhythm: Normal rate  and regular rhythm.     Pulses: Normal pulses.          Radial pulses are 2+ on the right side and 2+ on the left side.     Heart sounds: Normal heart sounds. No murmur heard.    No friction rub. No gallop.  Pulmonary:     Effort: Pulmonary effort is normal.     Breath sounds: Normal breath sounds. No decreased air movement. No decreased breath sounds, wheezing, rhonchi or rales.  Abdominal:     General: Abdomen is flat. Bowel sounds are normal.     Palpations: Abdomen is soft.     Tenderness: There is no abdominal tenderness.  Musculoskeletal:        General: Normal range of motion.     Cervical back: Normal range of motion  and neck supple. No pain with movement.     Right lower leg: No edema.     Left lower leg: No edema.  Lymphadenopathy:     Head:     Right side of head: No submental, submandibular or preauricular adenopathy.     Left side of head: No submental, submandibular or preauricular adenopathy.     Cervical:     Right cervical: No superficial or posterior cervical adenopathy.    Left cervical: No superficial or posterior cervical adenopathy.     Upper Body:     Right upper body: No supraclavicular adenopathy.     Left upper body: No supraclavicular adenopathy.  Skin:    General: Skin is warm and dry.     Capillary Refill: Capillary refill takes less than 2 seconds.  Neurological:     General: No focal deficit present.     Mental Status: She is alert and oriented to person, place, and time.     GCS: GCS eye subscore is 4. GCS verbal subscore is 5. GCS motor subscore is 6.     Cranial Nerves: No cranial nerve deficit, dysarthria or facial asymmetry.     Motor: No weakness, tremor, atrophy or abnormal muscle tone.     Gait: Gait is intact.     Deep Tendon Reflexes:     Reflex Scores:      Patellar reflexes are 0 on the right side and 0 on the left side. Psychiatric:        Attention and Perception: Attention and perception normal.        Mood and Affect: Mood and  affect normal.        Speech: Speech normal.        Behavior: Behavior normal. Behavior is cooperative.        Thought Content: Thought content normal.        Cognition and Memory: Cognition and memory normal.        Judgment: Judgment normal.      No results found for this or any previous visit (from the past 2160 hour(s)).   Fall Risk:    06/25/2023    2:46 PM 11/23/2022    9:20 AM 09/21/2022    9:44 AM 06/15/2022   10:55 AM 01/19/2022    1:52 PM  Fall Risk   Falls in the past year? 0 0 0 0 0  Number falls in past yr: 0 0 0 0 0  Injury with Fall? 0 0 0 0 0  Risk for fall due to : No Fall Risks No Fall Risks No Fall Risks No Fall Risks No Fall Risks  Follow up Falls prevention discussed;Education provided;Falls evaluation completed Falls prevention discussed;Education provided;Falls evaluation completed Falls prevention discussed;Education provided;Falls evaluation completed Falls prevention discussed;Education provided;Falls evaluation completed Falls prevention discussed;Education provided     Functional Status Survey: Is the patient deaf or have difficulty hearing?: No Does the patient have difficulty seeing, even when wearing glasses/contacts?: No Does the patient have difficulty concentrating, remembering, or making decisions?: No Does the patient have difficulty walking or climbing stairs?: No Does the patient have difficulty dressing or bathing?: No Does the patient have difficulty doing errands alone such as visiting a doctor's office or shopping?: No   Assessment & Plan  Problem List Items Addressed This Visit   None Visit Diagnoses     Annual physical exam    -  Primary      -USPSTF grade A and B recommendations reviewed  with patient; age-appropriate recommendations, preventive care, screening tests, etc discussed and encouraged; healthy living encouraged; see AVS for patient education given to patient -Discussed importance of 150 minutes of physical activity  weekly, eat two servings of fish weekly, eat one serving of tree nuts ( cashews, pistachios, pecans, almonds.Marland Kitchen) every other day, eat 6 servings of fruit/vegetables daily and drink plenty of water and avoid sweet beverages.   -Reviewed Health Maintenance: Yes.    Patient reports they would prefer to have labs done at workplace. Requested that she have them sent to office once complete    Return in about 1 year (around 06/24/2024) for Annual Physical.   I, Cloys Vera E Jemila Camille, PA-C, have reviewed all documentation for this visit. The documentation on 06/26/23 for the exam, diagnosis, procedures, and orders are all accurate and complete.   Jacquelin Hawking, MHS, PA-C [facility]

## 2023-06-26 ENCOUNTER — Encounter: Payer: Self-pay | Admitting: Physician Assistant

## 2023-07-08 ENCOUNTER — Encounter: Payer: Self-pay | Admitting: Neurology

## 2023-07-09 ENCOUNTER — Encounter: Payer: Self-pay | Admitting: Neurology

## 2023-07-10 ENCOUNTER — Ambulatory Visit: Payer: BC Managed Care – PPO | Admitting: Neurology

## 2023-07-12 ENCOUNTER — Ambulatory Visit
Admission: RE | Admit: 2023-07-12 | Discharge: 2023-07-12 | Disposition: A | Discharge status: Home / Self Care | Payer: BC Managed Care – PPO | Source: Ambulatory Visit | Attending: Neurology | Admitting: Neurology

## 2023-07-12 DIAGNOSIS — G43009 Migraine without aura, not intractable, without status migrainosus: Secondary | ICD-10-CM | POA: Diagnosis not present

## 2023-07-12 MED ORDER — GADOPICLENOL 0.5 MMOL/ML IV SOLN
7.5000 mL | Freq: Once | INTRAVENOUS | Status: AC | PRN
  Administered 2023-07-12: 7.5 mL via INTRAVENOUS

## 2023-08-18 ENCOUNTER — Other Ambulatory Visit: Payer: Self-pay | Admitting: Neurology

## 2023-08-26 NOTE — Progress Notes (Unsigned)
NEUROLOGY FOLLOW UP OFFICE NOTE  Cassidy Lewis [id number]  Assessment/Plan:   Migraine without aura, without status migrainosus, not intractable Elevated blood pressure  Migraine prevention:  Plan to start Emgality.  Continue nortriptyline 25mg  at bedtime for now but plan to discontinue (concern about elevated blood pressure) Migraine rescue:  Tylenol and Zofran Limit use of pain relievers to no more than 2 days out of week to prevent risk of rebound or medication-overuse headache. Keep headache diary Follow up 6 months.     Subjective:  Cassidy Lewis is a 53 year old female who follows up for migraines.  UPDATE: MRI of brain with and without contrast on 07/12/2023 personally reviewed was normal.  Increased nortriptyline to 25mg  at bedtime. Noted improvement.  No longer takes an analgesic Intensity:  5-6/10 Duration:  a few hours untreated Frequency:  2 to 3 times a week (1 a week is severe) Current NSAIDS/analgesics:  none Current triptans:  none Current ergotamine:  none Current anti-emetic:  Zofran 4mg  Current muscle relaxants:  none Current Antihypertensive medications:  none Current Antidepressant medications:  nortriptyline 25mg  QHS Current Anticonvulsant medications:  none Current anti-CGRP:  none Current Vitamins/Herbal/Supplements:  B12, D, C Current Antihistamines/Decongestants:  none Other therapy:  none   Caffeine:  1 cup coffee daily.  1 Pepsi with dinner Diet:  32 oz water daily. Exercise:  goes to gym twice a week Depression:  no; Anxiety:  no Other pain:  no Sleep hygiene:  Hot flashes affect sleep.  Restless. Tends to occur more when she is at work.  Works in the [employer] at [employer].  Wears headphone and reads the orders on screens.  Works from 3:30 AM to 2:00 PM.  Tries to go to bed at 6:30 PM and wakes up 2:15 AM.  HISTORY: Onset:  On and off since around age 73, around to time she started perimenopause.  No prior history of  headaches. Location:  diffuse but can be unilateral, sometimes base of skull hurts.  No neck pain.   Quality:  pulsating Intensity:  5-6/10 (8/10 prior to nortriptyline) Aura:  absent Prodrome:  absent Associated symptoms:  Sometimes photophobia, phonophobia or sees small spots in her vision.  Occasional nausea.  She denies associated unilateral numbness or weakness. Duration:  10 minutes with Tylenol Frequency:  3 to 4 days a week Frequency of abortive medication: 3 to 4 days a week Triggers:  noise, computer screens Relieving factors:  sleep Activity:  aggravates  Cervical X-ray from 09/21/2022 personally reviewed showed mild bilateral facet arthropathy at C7-T1 but no acute abnormalities.  Past NSAIDS/analgesics:  Excedrin, ibuprofen, meloxicam Past abortive triptans:  none Past abortive ergotamine:  none Past muscle relaxants:  baclofen Past anti-emetic:  none Past antihypertensive medications:  none Past antidepressant medications:  venlafaxine Past anticonvulsant medications:  none Past anti-CGRP:  none Other past therapies:  none    History of partial hysterectomy in her 30s (endometriosis, family history of ovarian cancer) Family history of headache:  sister (cervicogenic headache)  PAST MEDICAL HISTORY: Past Medical History:  Diagnosis Date   Advanced care planning/counseling discussion 06/25/2023   Endometriosis    Irritability 07/25/2018   Left flank pain 05/28/2017   Ovarian cyst    Pelvic fullness in female 03/03/2015   Vitamin B12 deficiency    Vitamin D deficiency disease     MEDICATIONS: Current Outpatient Medications on File Prior to Visit  Medication Sig Dispense Refill   Ascorbic Acid (VITAMIN C ADULT GUMMIES PO) Take by  mouth daily. (Patient not taking: Reported on 06/25/2023)     EPINEPHrine 0.3 mg/0.3 mL IJ SOAJ injection Inject 0.3 mLs (0.3 mg total) into the muscle as needed for anaphylaxis (for life-threatening allergic reaction). 1 Device 1    nortriptyline (PAMELOR) 25 MG capsule Take 1 capsule by mouth at bedtime 30 capsule 0   rosuvastatin (CRESTOR) 20 MG tablet Take 1 tablet (20 mg total) by mouth daily. (Patient not taking: Reported on 06/25/2023) 90 tablet 3   vitamin B-12 (CYANOCOBALAMIN) 250 MCG tablet Take 250 mcg by mouth daily. (Patient not taking: Reported on 12/28/2022)     No current facility-administered medications on file prior to visit.    ALLERGIES: Allergies  Allergen Reactions   Coconut (Cocos Nucifera) Swelling   Penicillins Anaphylaxis   Macrobid [Nitrofurantoin]     FAMILY HISTORY: Family History  Problem Relation Age of Onset   Cancer Mother        ovarian   Stroke Mother        possibly a small stroke   Depression Mother        Bipolar   Diabetes Father    Hyperlipidemia Father    Hypertension Father    Cancer Father        stomach cancer   Cancer Paternal Grandfather        lung cancer, died at age 85   Diabetes Maternal Grandmother    Gout Maternal Grandmother    Stroke Sister        fraction strokes   Endometriosis Sister    Migraines Sister    Heart failure Paternal Grandmother    COPD Neg Hx    Heart disease Neg Hx       Objective:  Blood pressure (!) 140/70, pulse 98, height 5\' 7"  (1.702 m), weight 164 lb (74.4 kg), SpO2 100%. General: No acute distress.  Patient appears well-groomed.   Head:  Normocephalic/atraumatic Eyes:  Fundi examined but not visualized Neck: supple, no paraspinal tenderness, full range of motion Heart:  Regular rate and rhythm Lungs:  Clear to auscultation bilaterally Back: No paraspinal tenderness Neurological Exam: alert and oriented.  Speech fluent and not dysarthric, language intact.  CN II-XII intact. Bulk and tone normal, muscle strength 5/5 throughout.  Sensation to light touch intact.  Deep tendon reflexes 2+ throughout, toes downgoing.  Finger to nose testing intact.  Gait normal, Romberg negative.   Shon Millet, DO  CC: Danelle Berry,  PA-C

## 2023-08-27 ENCOUNTER — Ambulatory Visit: Payer: BC Managed Care – PPO | Admitting: Neurology

## 2023-08-27 ENCOUNTER — Encounter: Payer: Self-pay | Admitting: Neurology

## 2023-08-27 VITALS — BP 140/70 | HR 98 | Ht 67.0 in | Wt 164.0 lb

## 2023-08-27 DIAGNOSIS — G43009 Migraine without aura, not intractable, without status migrainosus: Secondary | ICD-10-CM

## 2023-08-27 DIAGNOSIS — R030 Elevated blood-pressure reading, without diagnosis of hypertension: Secondary | ICD-10-CM

## 2023-08-27 MED ORDER — EMGALITY 120 MG/ML ~~LOC~~ SOAJ: 240.0000 mg | Freq: Once | SUBCUTANEOUS | 0 refills | Status: AC

## 2023-08-27 MED ORDER — NORTRIPTYLINE HCL 25 MG PO CAPS: 25.0000 mg | ORAL_CAPSULE | Freq: Every day | ORAL | 6 refills | Status: AC

## 2023-08-27 NOTE — Patient Instructions (Signed)
Start emgality - 2 injections for first dose, then 1 injection every 28 days thereafter.  When you pick up first dose, make sure you have 2 pens and contact me so I can send in standing order Take Tylenol and Zofran/ondansetron as needed.  Limit use of pain relievers to no more than 10 days out of month to prevent risk of rebound or medication-overuse headache. Keep headache diary Follow up 6 months.

## 2023-09-04 ENCOUNTER — Telehealth: Payer: Self-pay

## 2023-09-04 NOTE — Telephone Encounter (Signed)
PA needed for [employer]

## 2023-09-16 ENCOUNTER — Telehealth: Payer: Self-pay

## 2023-09-16 ENCOUNTER — Telehealth: Payer: Self-pay | Admitting: Neurology

## 2023-09-16 NOTE — Telephone Encounter (Signed)
 PA status

## 2023-09-16 NOTE — Telephone Encounter (Signed)
 Pt. needs refill of Rx PAMELOR , Pt. Would like a call once sent to pharmacy [facility] on [address] ,[place]

## 2023-09-16 NOTE — Telephone Encounter (Signed)
 Called pt and she was checking on Emgality . I told her I would check on it. I made a new PA for her. She understood.

## 2023-09-16 NOTE — Telephone Encounter (Signed)
 PA needed

## 2023-09-19 DIAGNOSIS — M542 Cervicalgia: Secondary | ICD-10-CM | POA: Diagnosis not present

## 2023-09-19 DIAGNOSIS — R519 Headache, unspecified: Secondary | ICD-10-CM | POA: Diagnosis not present

## 2023-09-19 DIAGNOSIS — M9902 Segmental and somatic dysfunction of thoracic region: Secondary | ICD-10-CM | POA: Diagnosis not present

## 2023-09-19 DIAGNOSIS — M9901 Segmental and somatic dysfunction of cervical region: Secondary | ICD-10-CM | POA: Diagnosis not present

## 2023-09-23 DIAGNOSIS — M542 Cervicalgia: Secondary | ICD-10-CM | POA: Diagnosis not present

## 2023-09-23 DIAGNOSIS — R519 Headache, unspecified: Secondary | ICD-10-CM | POA: Diagnosis not present

## 2023-09-23 DIAGNOSIS — M9902 Segmental and somatic dysfunction of thoracic region: Secondary | ICD-10-CM | POA: Diagnosis not present

## 2023-09-23 DIAGNOSIS — M9901 Segmental and somatic dysfunction of cervical region: Secondary | ICD-10-CM | POA: Diagnosis not present

## 2023-09-30 DIAGNOSIS — R519 Headache, unspecified: Secondary | ICD-10-CM | POA: Diagnosis not present

## 2023-09-30 DIAGNOSIS — M9901 Segmental and somatic dysfunction of cervical region: Secondary | ICD-10-CM | POA: Diagnosis not present

## 2023-09-30 DIAGNOSIS — M542 Cervicalgia: Secondary | ICD-10-CM | POA: Diagnosis not present

## 2023-09-30 DIAGNOSIS — M9902 Segmental and somatic dysfunction of thoracic region: Secondary | ICD-10-CM | POA: Diagnosis not present

## 2023-09-30 NOTE — Telephone Encounter (Signed)
 PA status

## 2023-10-02 DIAGNOSIS — M9902 Segmental and somatic dysfunction of thoracic region: Secondary | ICD-10-CM | POA: Diagnosis not present

## 2023-10-02 DIAGNOSIS — R519 Headache, unspecified: Secondary | ICD-10-CM | POA: Diagnosis not present

## 2023-10-02 DIAGNOSIS — M542 Cervicalgia: Secondary | ICD-10-CM | POA: Diagnosis not present

## 2023-10-02 DIAGNOSIS — M9901 Segmental and somatic dysfunction of cervical region: Secondary | ICD-10-CM | POA: Diagnosis not present

## 2023-10-03 DIAGNOSIS — M542 Cervicalgia: Secondary | ICD-10-CM | POA: Diagnosis not present

## 2023-10-03 DIAGNOSIS — M9901 Segmental and somatic dysfunction of cervical region: Secondary | ICD-10-CM | POA: Diagnosis not present

## 2023-10-03 DIAGNOSIS — M9902 Segmental and somatic dysfunction of thoracic region: Secondary | ICD-10-CM | POA: Diagnosis not present

## 2023-10-03 DIAGNOSIS — R519 Headache, unspecified: Secondary | ICD-10-CM | POA: Diagnosis not present

## 2023-10-07 DIAGNOSIS — M542 Cervicalgia: Secondary | ICD-10-CM | POA: Diagnosis not present

## 2023-10-07 DIAGNOSIS — M9901 Segmental and somatic dysfunction of cervical region: Secondary | ICD-10-CM | POA: Diagnosis not present

## 2023-10-07 DIAGNOSIS — R519 Headache, unspecified: Secondary | ICD-10-CM | POA: Diagnosis not present

## 2023-10-07 DIAGNOSIS — M9902 Segmental and somatic dysfunction of thoracic region: Secondary | ICD-10-CM | POA: Diagnosis not present

## 2023-10-09 DIAGNOSIS — M9902 Segmental and somatic dysfunction of thoracic region: Secondary | ICD-10-CM | POA: Diagnosis not present

## 2023-10-09 DIAGNOSIS — M542 Cervicalgia: Secondary | ICD-10-CM | POA: Diagnosis not present

## 2023-10-09 DIAGNOSIS — R519 Headache, unspecified: Secondary | ICD-10-CM | POA: Diagnosis not present

## 2023-10-09 DIAGNOSIS — M9901 Segmental and somatic dysfunction of cervical region: Secondary | ICD-10-CM | POA: Diagnosis not present

## 2023-10-10 DIAGNOSIS — M542 Cervicalgia: Secondary | ICD-10-CM | POA: Diagnosis not present

## 2023-10-10 DIAGNOSIS — R519 Headache, unspecified: Secondary | ICD-10-CM | POA: Diagnosis not present

## 2023-10-10 DIAGNOSIS — M9902 Segmental and somatic dysfunction of thoracic region: Secondary | ICD-10-CM | POA: Diagnosis not present

## 2023-10-10 DIAGNOSIS — M9901 Segmental and somatic dysfunction of cervical region: Secondary | ICD-10-CM | POA: Diagnosis not present

## 2023-10-14 DIAGNOSIS — M542 Cervicalgia: Secondary | ICD-10-CM | POA: Diagnosis not present

## 2023-10-14 DIAGNOSIS — M9902 Segmental and somatic dysfunction of thoracic region: Secondary | ICD-10-CM | POA: Diagnosis not present

## 2023-10-14 DIAGNOSIS — R519 Headache, unspecified: Secondary | ICD-10-CM | POA: Diagnosis not present

## 2023-10-14 DIAGNOSIS — M9901 Segmental and somatic dysfunction of cervical region: Secondary | ICD-10-CM | POA: Diagnosis not present

## 2023-10-16 DIAGNOSIS — M9901 Segmental and somatic dysfunction of cervical region: Secondary | ICD-10-CM | POA: Diagnosis not present

## 2023-10-16 DIAGNOSIS — R519 Headache, unspecified: Secondary | ICD-10-CM | POA: Diagnosis not present

## 2023-10-16 DIAGNOSIS — M9902 Segmental and somatic dysfunction of thoracic region: Secondary | ICD-10-CM | POA: Diagnosis not present

## 2023-10-16 DIAGNOSIS — M542 Cervicalgia: Secondary | ICD-10-CM | POA: Diagnosis not present

## 2023-10-17 DIAGNOSIS — M9902 Segmental and somatic dysfunction of thoracic region: Secondary | ICD-10-CM | POA: Diagnosis not present

## 2023-10-17 DIAGNOSIS — R519 Headache, unspecified: Secondary | ICD-10-CM | POA: Diagnosis not present

## 2023-10-17 DIAGNOSIS — M9901 Segmental and somatic dysfunction of cervical region: Secondary | ICD-10-CM | POA: Diagnosis not present

## 2023-10-17 DIAGNOSIS — M542 Cervicalgia: Secondary | ICD-10-CM | POA: Diagnosis not present

## 2023-10-22 DIAGNOSIS — R519 Headache, unspecified: Secondary | ICD-10-CM | POA: Diagnosis not present

## 2023-10-22 DIAGNOSIS — M542 Cervicalgia: Secondary | ICD-10-CM | POA: Diagnosis not present

## 2023-10-22 DIAGNOSIS — M9901 Segmental and somatic dysfunction of cervical region: Secondary | ICD-10-CM | POA: Diagnosis not present

## 2023-10-22 DIAGNOSIS — M9902 Segmental and somatic dysfunction of thoracic region: Secondary | ICD-10-CM | POA: Diagnosis not present

## 2023-10-24 DIAGNOSIS — M542 Cervicalgia: Secondary | ICD-10-CM | POA: Diagnosis not present

## 2023-10-24 DIAGNOSIS — R519 Headache, unspecified: Secondary | ICD-10-CM | POA: Diagnosis not present

## 2023-10-24 DIAGNOSIS — M9902 Segmental and somatic dysfunction of thoracic region: Secondary | ICD-10-CM | POA: Diagnosis not present

## 2023-10-24 DIAGNOSIS — M9901 Segmental and somatic dysfunction of cervical region: Secondary | ICD-10-CM | POA: Diagnosis not present

## 2023-10-30 DIAGNOSIS — R519 Headache, unspecified: Secondary | ICD-10-CM | POA: Diagnosis not present

## 2023-10-30 DIAGNOSIS — M9901 Segmental and somatic dysfunction of cervical region: Secondary | ICD-10-CM | POA: Diagnosis not present

## 2023-10-30 DIAGNOSIS — M9902 Segmental and somatic dysfunction of thoracic region: Secondary | ICD-10-CM | POA: Diagnosis not present

## 2023-10-30 DIAGNOSIS — M542 Cervicalgia: Secondary | ICD-10-CM | POA: Diagnosis not present

## 2023-11-13 DIAGNOSIS — R519 Headache, unspecified: Secondary | ICD-10-CM | POA: Diagnosis not present

## 2023-11-13 DIAGNOSIS — M542 Cervicalgia: Secondary | ICD-10-CM | POA: Diagnosis not present

## 2023-11-13 DIAGNOSIS — M9902 Segmental and somatic dysfunction of thoracic region: Secondary | ICD-10-CM | POA: Diagnosis not present

## 2023-11-13 DIAGNOSIS — M9901 Segmental and somatic dysfunction of cervical region: Secondary | ICD-10-CM | POA: Diagnosis not present

## 2023-12-03 ENCOUNTER — Telehealth: Payer: Self-pay | Admitting: Pharmacy Technician

## 2023-12-03 ENCOUNTER — Other Ambulatory Visit (HOSPITAL_COMMUNITY): Payer: Self-pay

## 2023-12-03 NOTE — Telephone Encounter (Signed)
 Pharmacy Patient Advocate Encounter   Received notification from CoverMyMeds that prior authorization for EMGALITY  120MG  is required/requested.   Insurance verification completed.   The patient is insured through [facility] .   Per test claim: PA required; PA submitted to above mentioned insurance via CoverMyMeds Key/confirmation #/EOC [id number] Status is pending

## 2023-12-03 NOTE — Telephone Encounter (Signed)
 Pharmacy Patient Advocate Encounter  Received notification from [employer] that Prior Authorization for EMGALITY  120MG  has been DENIED.  Full denial letter will be uploaded to the media tab. See denial reason below.   PA #/Case ID/Reference #: [id number]

## 2023-12-04 DIAGNOSIS — R519 Headache, unspecified: Secondary | ICD-10-CM | POA: Diagnosis not present

## 2023-12-04 DIAGNOSIS — M9901 Segmental and somatic dysfunction of cervical region: Secondary | ICD-10-CM | POA: Diagnosis not present

## 2023-12-04 DIAGNOSIS — M9902 Segmental and somatic dysfunction of thoracic region: Secondary | ICD-10-CM | POA: Diagnosis not present

## 2023-12-04 DIAGNOSIS — M542 Cervicalgia: Secondary | ICD-10-CM | POA: Diagnosis not present

## 2023-12-10 ENCOUNTER — Telehealth: Payer: Self-pay | Admitting: Pharmacy Technician

## 2023-12-10 NOTE — Telephone Encounter (Signed)
 Pharmacy Patient Advocate Encounter   Received notification from CoverMyMeds that prior authorization for EMGALITY  120MG  is required/requested.   Insurance verification completed.   The patient is insured through [facility] .   Per test claim: PA required; PA submitted to above mentioned insurance via CoverMyMeds Key/confirmation #/EOC [id number] Status is pending

## 2023-12-10 NOTE — Telephone Encounter (Signed)
 Prior authorization has been resubmitted with additional information provided.

## 2023-12-12 ENCOUNTER — Other Ambulatory Visit (HOSPITAL_COMMUNITY): Payer: Self-pay

## 2023-12-12 NOTE — Telephone Encounter (Signed)
 Pharmacy Patient Advocate Encounter  Received notification from [employer] that Prior Authorization for EMGALITY  120MG  has been APPROVED from 3.6.25 to 11.5.25. Ran test claim, Copay is $35. This test claim was processed through [facility]- copay amounts may vary at other pharmacies due to pharmacy/plan contracts, or as the patient moves through the different stages of their insurance plan.   PA #/Case ID/Reference #: [id number]

## 2023-12-25 DIAGNOSIS — M9901 Segmental and somatic dysfunction of cervical region: Secondary | ICD-10-CM | POA: Diagnosis not present

## 2023-12-25 DIAGNOSIS — R519 Headache, unspecified: Secondary | ICD-10-CM | POA: Diagnosis not present

## 2023-12-25 DIAGNOSIS — M9902 Segmental and somatic dysfunction of thoracic region: Secondary | ICD-10-CM | POA: Diagnosis not present

## 2023-12-25 DIAGNOSIS — M542 Cervicalgia: Secondary | ICD-10-CM | POA: Diagnosis not present

## 2024-01-15 DIAGNOSIS — M9902 Segmental and somatic dysfunction of thoracic region: Secondary | ICD-10-CM | POA: Diagnosis not present

## 2024-01-15 DIAGNOSIS — M542 Cervicalgia: Secondary | ICD-10-CM | POA: Diagnosis not present

## 2024-01-15 DIAGNOSIS — R519 Headache, unspecified: Secondary | ICD-10-CM | POA: Diagnosis not present

## 2024-01-15 DIAGNOSIS — M9901 Segmental and somatic dysfunction of cervical region: Secondary | ICD-10-CM | POA: Diagnosis not present

## 2024-02-05 DIAGNOSIS — M9901 Segmental and somatic dysfunction of cervical region: Secondary | ICD-10-CM | POA: Diagnosis not present

## 2024-02-05 DIAGNOSIS — M542 Cervicalgia: Secondary | ICD-10-CM | POA: Diagnosis not present

## 2024-02-05 DIAGNOSIS — R519 Headache, unspecified: Secondary | ICD-10-CM | POA: Diagnosis not present

## 2024-02-05 DIAGNOSIS — M9902 Segmental and somatic dysfunction of thoracic region: Secondary | ICD-10-CM | POA: Diagnosis not present

## 2024-03-04 DIAGNOSIS — M9901 Segmental and somatic dysfunction of cervical region: Secondary | ICD-10-CM | POA: Diagnosis not present

## 2024-03-04 DIAGNOSIS — M9902 Segmental and somatic dysfunction of thoracic region: Secondary | ICD-10-CM | POA: Diagnosis not present

## 2024-03-04 DIAGNOSIS — M542 Cervicalgia: Secondary | ICD-10-CM | POA: Diagnosis not present

## 2024-03-04 DIAGNOSIS — R519 Headache, unspecified: Secondary | ICD-10-CM | POA: Diagnosis not present

## 2024-03-06 ENCOUNTER — Ambulatory Visit: Payer: BC Managed Care – PPO | Admitting: Neurology

## 2024-04-07 DIAGNOSIS — M9901 Segmental and somatic dysfunction of cervical region: Secondary | ICD-10-CM | POA: Diagnosis not present

## 2024-04-07 DIAGNOSIS — M542 Cervicalgia: Secondary | ICD-10-CM | POA: Diagnosis not present

## 2024-04-07 DIAGNOSIS — R519 Headache, unspecified: Secondary | ICD-10-CM | POA: Diagnosis not present

## 2024-04-07 DIAGNOSIS — M9902 Segmental and somatic dysfunction of thoracic region: Secondary | ICD-10-CM | POA: Diagnosis not present

## 2024-05-05 DIAGNOSIS — M542 Cervicalgia: Secondary | ICD-10-CM | POA: Diagnosis not present

## 2024-05-05 DIAGNOSIS — M9902 Segmental and somatic dysfunction of thoracic region: Secondary | ICD-10-CM | POA: Diagnosis not present

## 2024-05-05 DIAGNOSIS — R519 Headache, unspecified: Secondary | ICD-10-CM | POA: Diagnosis not present

## 2024-05-05 DIAGNOSIS — M9901 Segmental and somatic dysfunction of cervical region: Secondary | ICD-10-CM | POA: Diagnosis not present

## 2024-06-02 DIAGNOSIS — M9902 Segmental and somatic dysfunction of thoracic region: Secondary | ICD-10-CM | POA: Diagnosis not present

## 2024-06-02 DIAGNOSIS — M9901 Segmental and somatic dysfunction of cervical region: Secondary | ICD-10-CM | POA: Diagnosis not present

## 2024-06-02 DIAGNOSIS — R519 Headache, unspecified: Secondary | ICD-10-CM | POA: Diagnosis not present

## 2024-06-02 DIAGNOSIS — M542 Cervicalgia: Secondary | ICD-10-CM | POA: Diagnosis not present

## 2024-06-30 DIAGNOSIS — M9902 Segmental and somatic dysfunction of thoracic region: Secondary | ICD-10-CM | POA: Diagnosis not present

## 2024-06-30 DIAGNOSIS — M542 Cervicalgia: Secondary | ICD-10-CM | POA: Diagnosis not present

## 2024-06-30 DIAGNOSIS — R519 Headache, unspecified: Secondary | ICD-10-CM | POA: Diagnosis not present

## 2024-06-30 DIAGNOSIS — M9901 Segmental and somatic dysfunction of cervical region: Secondary | ICD-10-CM | POA: Diagnosis not present

## 2024-08-04 DIAGNOSIS — R519 Headache, unspecified: Secondary | ICD-10-CM | POA: Diagnosis not present

## 2024-08-04 DIAGNOSIS — M542 Cervicalgia: Secondary | ICD-10-CM | POA: Diagnosis not present

## 2024-08-04 DIAGNOSIS — M9902 Segmental and somatic dysfunction of thoracic region: Secondary | ICD-10-CM | POA: Diagnosis not present

## 2024-08-04 DIAGNOSIS — M9901 Segmental and somatic dysfunction of cervical region: Secondary | ICD-10-CM | POA: Diagnosis not present
